# Patient Record
Sex: Male | Born: 1955 | Race: White | Hispanic: No | Marital: Married | Smoking: Current some day smoker
Health system: Southern US, Community
[De-identification: ages and names within clinical notes are randomized; demographics above are authoritative.]

## PROBLEM LIST (undated history)

## (undated) DIAGNOSIS — I2510 Atherosclerotic heart disease of native coronary artery without angina pectoris: Secondary | ICD-10-CM

## (undated) DIAGNOSIS — I219 Acute myocardial infarction, unspecified: Secondary | ICD-10-CM

## (undated) DIAGNOSIS — J8410 Pulmonary fibrosis, unspecified: Secondary | ICD-10-CM

## (undated) DIAGNOSIS — F419 Anxiety disorder, unspecified: Secondary | ICD-10-CM

## (undated) DIAGNOSIS — J439 Emphysema, unspecified: Secondary | ICD-10-CM

## (undated) DIAGNOSIS — M1990 Unspecified osteoarthritis, unspecified site: Secondary | ICD-10-CM

## (undated) HISTORY — DX: Anxiety disorder, unspecified: F41.9

## (undated) HISTORY — PX: NO PAST SURGERIES: SHX2092

## (undated) HISTORY — DX: Pulmonary fibrosis, unspecified: J84.10

## (undated) HISTORY — DX: Emphysema, unspecified: J43.9

---

## 2003-07-17 ENCOUNTER — Encounter
Admission: RE | Admit: 2003-07-17 | Discharge: 2003-10-15 | Payer: Self-pay | Admitting: Physical Medicine & Rehabilitation

## 2003-08-24 ENCOUNTER — Ambulatory Visit (HOSPITAL_COMMUNITY): Admission: RE | Admit: 2003-08-24 | Discharge: 2003-08-24 | Payer: Self-pay | Admitting: Anesthesiology

## 2004-09-05 ENCOUNTER — Encounter: Admission: RE | Admit: 2004-09-05 | Discharge: 2004-09-05 | Payer: Self-pay | Admitting: Orthopedic Surgery

## 2004-09-22 ENCOUNTER — Ambulatory Visit (HOSPITAL_COMMUNITY): Admission: RE | Admit: 2004-09-22 | Discharge: 2004-09-22 | Payer: Self-pay | Admitting: Orthopedic Surgery

## 2004-10-04 ENCOUNTER — Ambulatory Visit (HOSPITAL_COMMUNITY): Admission: RE | Admit: 2004-10-04 | Discharge: 2004-10-04 | Payer: Self-pay | Admitting: Orthopedic Surgery

## 2004-10-04 ENCOUNTER — Ambulatory Visit (HOSPITAL_BASED_OUTPATIENT_CLINIC_OR_DEPARTMENT_OTHER): Admission: RE | Admit: 2004-10-04 | Discharge: 2004-10-04 | Payer: Self-pay | Admitting: Orthopedic Surgery

## 2005-02-16 ENCOUNTER — Ambulatory Visit (HOSPITAL_COMMUNITY): Admission: RE | Admit: 2005-02-16 | Discharge: 2005-02-16 | Payer: Self-pay | Admitting: Orthopedic Surgery

## 2005-08-04 ENCOUNTER — Encounter: Admission: RE | Admit: 2005-08-04 | Discharge: 2005-08-04 | Payer: Self-pay | Admitting: Neurosurgery

## 2018-04-05 ENCOUNTER — Telehealth: Payer: Self-pay | Admitting: Cardiovascular Disease

## 2018-04-05 ENCOUNTER — Ambulatory Visit (INDEPENDENT_AMBULATORY_CARE_PROVIDER_SITE_OTHER): Payer: Non-veteran care | Admitting: Cardiovascular Disease

## 2018-04-05 ENCOUNTER — Encounter: Payer: Self-pay | Admitting: Cardiovascular Disease

## 2018-04-05 DIAGNOSIS — I214 Non-ST elevation (NSTEMI) myocardial infarction: Secondary | ICD-10-CM | POA: Diagnosis not present

## 2018-04-05 DIAGNOSIS — E785 Hyperlipidemia, unspecified: Secondary | ICD-10-CM | POA: Insufficient documentation

## 2018-04-05 DIAGNOSIS — E782 Mixed hyperlipidemia: Secondary | ICD-10-CM | POA: Diagnosis not present

## 2018-04-05 DIAGNOSIS — Z720 Tobacco use: Secondary | ICD-10-CM | POA: Insufficient documentation

## 2018-04-05 DIAGNOSIS — I10 Essential (primary) hypertension: Secondary | ICD-10-CM | POA: Diagnosis not present

## 2018-04-05 DIAGNOSIS — R0989 Other specified symptoms and signs involving the circulatory and respiratory systems: Secondary | ICD-10-CM

## 2018-04-05 MED ORDER — BUPROPION HCL ER (SR) 150 MG PO TB12: 150.0000 mg | ORAL_TABLET | Freq: Two times a day (BID) | ORAL | 2 refills | Status: DC

## 2018-04-05 MED ORDER — METOPROLOL SUCCINATE ER 25 MG PO TB24: 25.0000 mg | ORAL_TABLET | Freq: Every day | ORAL | 3 refills | Status: DC

## 2018-04-05 MED ORDER — VARENICLINE TARTRATE 1 MG PO TABS: 1.0000 mg | ORAL_TABLET | Freq: Two times a day (BID) | ORAL | 1 refills | Status: DC

## 2018-04-05 MED ORDER — VARENICLINE TARTRATE 0.5 MG X 11 & 1 MG X 42 PO MISC: ORAL | 0 refills | Status: DC

## 2018-04-05 NOTE — Assessment & Plan Note (Signed)
History of hyperlipidemia on high-dose atorvastatin with recent non-STEMI.  Will check a lipid liver profile today

## 2018-04-05 NOTE — Assessment & Plan Note (Signed)
History of essential hypertension her blood pressure measured today at 144/88.  He is on low-dose metoprolol.  I am going to increase this to 25 mg of long-acting metoprolol a day.

## 2018-04-05 NOTE — Telephone Encounter (Signed)
04/05/2018 Patient walked in office with an [employer] Form to be filled out he signed release form and gave me 25.00 check, I then inter-office both form and check to [employer] to process.  cbr

## 2018-04-05 NOTE — Assessment & Plan Note (Signed)
Bilateral carotid bruits on exam today.  We will obtain carotid Doppler studies to further evaluate.

## 2018-04-05 NOTE — Progress Notes (Signed)
04/05/2018 [facility] Bissonette [facility]   1955-05-29  [id number]  Primary Physician Reeves Dam, MD Primary Cardiologist: Lorretta Harp MD Lupe Carney, [place]  HPI:  Phillip [name] is a 63 y.o. mildly overweight married Caucasian male father 2, grandfather 2 grandchildren referred by Tawanna Cooler at the [facility] to be established in my practice because of recent non-STEMI.  His wife Aldona Bar was the daughter of "Theadore Nan" Phineas Douglas who is my long-term patient and who died on 22-May-2015.  The patient has a history of 100-pack-year tobacco abuse currently smoking 1/2 pack/day as well as hypertension hyperlipidemia.  There is no family history.  He is never had a stroke but did have a non-STEMI on 02/06/2018.  He is having chest pain and the [facility] and was transferred for cath at [facility] which happened the following day.  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  He is currently on low-dose aspirin and Plavix.  He has had no recurrent symptoms.   Current Meds  Medication Sig  . aspirin EC 81 MG tablet Take 81 mg by mouth daily.  Marland Kitchen atorvastatin (LIPITOR) 80 MG tablet Take 80 mg by mouth daily.  . clopidogrel (PLAVIX) 75 MG tablet Take 75 mg by mouth daily.  . metoprolol succinate (TOPROL-XL) 25 MG 24 hr tablet Take 25 mg by mouth daily.  . nitroGLYCERIN (NITROSTAT) 0.4 MG SL tablet Place 0.4 mg under the tongue every 5 (five) minutes as needed for chest pain.     Allergies  Allergen Reactions  . Iodinated Diagnostic Agents Dermatitis and Itching  . Prednisone Hives and Swelling    Social History   Socioeconomic History  . Marital status: Married    Spouse name: Not on file  . Number of children: Not on file  . Years of education: Not on file  . Highest education level: Not on file  Occupational History  . Not on file  Social Needs  . Financial resource strain: Not on file  . Food insecurity:    Worry: Not on file   Inability: Not on file  . Transportation needs:    Medical: Not on file    Non-medical: Not on file  Tobacco Use  . Smoking status: Not on file  Substance and Sexual Activity  . Alcohol use: Not on file  . Drug use: Not on file  . Sexual activity: Not on file  Lifestyle  . Physical activity:    Days per week: Not on file    Minutes per session: Not on file  . Stress: Not on file  Relationships  . Social connections:    Talks on phone: Not on file    Gets together: Not on file    Attends religious service: Not on file    Active member of club or organization: Not on file    Attends meetings of clubs or organizations: Not on file    Relationship status: Not on file  . Intimate partner violence:    Fear of current or ex partner: Not on file    Emotionally abused: Not on file    Physically abused: Not on file    Forced sexual activity: Not on file  Other Topics Concern  . Not on file  Social History Narrative  . Not on file     Review of Systems: General: negative for chills, fever, night sweats or weight changes.  Cardiovascular: negative for chest pain, dyspnea on  exertion, edema, orthopnea, palpitations, paroxysmal nocturnal dyspnea or shortness of breath Dermatological: negative for rash Respiratory: negative for cough or wheezing Urologic: negative for hematuria Abdominal: negative for nausea, vomiting, diarrhea, bright red blood per rectum, melena, or hematemesis Neurologic: negative for visual changes, syncope, or dizziness All other systems reviewed and are otherwise negative except as noted above.    Blood pressure (!) 144/88, pulse 70, height 5\' 9"  (1.753 m), weight 192 lb (87.1 kg).  General appearance: alert and no distress Neck: no adenopathy, no JVD, supple, symmetrical, trachea midline, thyroid not enlarged, symmetric, no tenderness/mass/nodules and Soft bilateral carotid bruits Lungs: clear to auscultation bilaterally Heart: regular rate and rhythm, S1,  S2 normal, no murmur, click, rub or gallop Extremities: extremities normal, atraumatic, no cyanosis or edema Pulses: 2+ and symmetric Skin: Skin color, texture, turgor normal. No rashes or lesions Neurologic: Alert and oriented X 3, normal strength and tone. Normal symmetric reflexes. Normal coordination and gait  EKG sinus rhythm at 70 RSR prime in lead V1 consistent with RV conduction delay and/or incomplete right bundle branch block.  I personally reviewed this EKG.  ASSESSMENT AND PLAN:   Tobacco abuse History of tobacco abuse with 100 pack years of smoking now smoking 1/2 pack/day considering options to stop  Hyperlipidemia History of hyperlipidemia on high-dose atorvastatin with recent non-STEMI.  Will check a lipid liver profile today  Essential hypertension History of essential hypertension her blood pressure measured today at 144/88.  He is on low-dose metoprolol.  I am going to increase this to 25 mg of long-acting metoprolol a day.  Non-STEMI (non-ST elevated myocardial infarction) ([place]) She of recent non-STEMI 12//11/19.  He was transferred from [facility] to [facility] where he underwent diagnostic coronary angiography revealing noncritical CAD.  He is currently on aspirin Plavix and has had no recurrent symptoms.      Lorretta Harp MD FACP,FACC,FAHA, [facility] 04/05/2018 11:26 AM

## 2018-04-05 NOTE — Patient Instructions (Addendum)
Medication Instructions:  Your physician has recommended you make the following change in your medication:  INCREASE YOUR METOPROLOL (TOPROL-XL) TO 25 MG DAILY  DISCUSS STARTING VARENICLINE (CHANTIX) OR BUPROPRION (WELLBUTRIN) WITH THE [facility]. YOU MAY START WHICHEVER ONE OF THESE MEDICATIONS IS COVERED BY YOUR INSURANCE, BUT DO NOT TAKE BOTH OF THESE MEDICATIONS. PLEASE FOLLOW THE MEDICATION DIRECTIONS PROVIDED ON THE PRESCRIPTION.  If you need a refill on your cardiac medications before your next appointment, please call your pharmacy.   Lab work: Your physician recommends that you return for lab work TODAY: LIPID/LIVER  If you have labs (blood work) drawn today and your tests are completely normal, you will receive your results only by: Marland Kitchen MyChart Message (if you have MyChart) OR . A paper copy in the mail If you have any lab test that is abnormal or we need to change your treatment, we will call you to review the results.  Testing/Procedures: Your physician has requested that you have a carotid duplex. This test is an ultrasound of the carotid arteries in your neck. It looks at blood flow through these arteries that supply the brain with blood. Allow one hour for this exam. There are no restrictions or special instructions.    Follow-Up: At [facility], you and your health needs are our priority.  As part of our continuing mission to provide you with exceptional heart care, we have created designated Provider Care Teams.  These Care Teams include your primary Cardiologist (physician) and Advanced Practice Providers (APPs -  Physician Assistants and Nurse Practitioners) who all work together to provide you with the care you need, when you need it. . You will need a follow up appointment in 6 months.  Please call our office 2 months in advance to schedule this appointment.  You may see Dr. Gwenlyn Found or one of the following Advanced Practice Providers on your designated Care Team:   . Kerin Ransom,  [place] . Almyra Deforest, PA-C . Fabian Sharp, PA-C . Jory Sims, DNP . Rosaria Ferries, PA-C . Roby Lofts, PA-C . Sande Rives, PA-C

## 2018-04-05 NOTE — Assessment & Plan Note (Signed)
She of recent non-STEMI 12//11/19.  He was transferred from [facility] to [facility] where he underwent diagnostic coronary angiography revealing noncritical CAD.  He is currently on aspirin Plavix and has had no recurrent symptoms.

## 2018-04-05 NOTE — Assessment & Plan Note (Signed)
History of tobacco abuse with 100 pack years of smoking now smoking 1/2 pack/day considering options to stop

## 2018-04-06 LAB — LIPID PANEL
Chol/HDL Ratio: 5.6 ratio — ABNORMAL HIGH (ref 0.0–5.0)
Cholesterol, Total: 202 mg/dL — ABNORMAL HIGH (ref 100–199)
HDL: 36 mg/dL — ABNORMAL LOW (ref >39)
LDL Calculated: 143 mg/dL — ABNORMAL HIGH (ref 0–99)
Triglycerides: 116 mg/dL (ref 0–149)
VLDL Cholesterol Cal: 23 mg/dL (ref 5–40)

## 2018-04-06 LAB — HEPATIC FUNCTION PANEL
ALT: 14 IU/L (ref 0–44)
AST: 15 IU/L (ref 0–40)
Albumin: 4.7 g/dL (ref 3.8–4.8)
Alkaline Phosphatase: 87 IU/L (ref 39–117)
Bilirubin Total: 0.5 mg/dL (ref 0.0–1.2)
Bilirubin, Direct: 0.13 mg/dL (ref 0.00–0.40)
Total Protein: 7.4 g/dL (ref 6.0–8.5)

## 2018-04-08 ENCOUNTER — Telehealth: Payer: Self-pay | Admitting: *Deleted

## 2018-04-08 DIAGNOSIS — E782 Mixed hyperlipidemia: Secondary | ICD-10-CM

## 2018-04-08 NOTE — Telephone Encounter (Signed)
-----   Message from Lorretta Harp, MD sent at 04/07/2018  4:59 PM EST ----- Refer to [facility] for repatha

## 2018-04-08 NOTE — Telephone Encounter (Signed)
Spoke with pt, he has not been taking the atorvastatin. He has the medication but has not started it yet. He will start the atorvastatin based on these numbers and will have labs checked when he goes to the [place] in June. Lab orders mailed to the pt

## 2018-04-09 ENCOUNTER — Ambulatory Visit (HOSPITAL_COMMUNITY)
Admission: RE | Admit: 2018-04-09 | Discharge: 2018-04-09 | Disposition: A | Discharge status: Home / Self Care | Payer: No Typology Code available for payment source | Source: Ambulatory Visit | Attending: Cardiology | Admitting: Cardiology

## 2018-04-09 DIAGNOSIS — R0989 Other specified symptoms and signs involving the circulatory and respiratory systems: Secondary | ICD-10-CM | POA: Diagnosis not present

## 2018-04-10 ENCOUNTER — Other Ambulatory Visit: Payer: Self-pay | Admitting: *Deleted

## 2018-04-11 ENCOUNTER — Telehealth: Payer: Self-pay | Admitting: Cardiovascular Disease

## 2018-04-11 NOTE — Telephone Encounter (Signed)
Just print prescriptions for both (again) and we can have Dr. Gwenlyn Found sign on Friday

## 2018-04-11 NOTE — Telephone Encounter (Signed)
Spoke with Rucel from [place] in [place] who states that pt unable to bring physical prescriptions for new meds to [place] because he lives far away and it is inconvenient for him. She requests a copy of pt recent labs and most recent MD office note. She also requests that a copy of Rx for Chantix, Wellbutrin, and metoprolol be sent to fax number [phone]. Informed her that Dr. Gwenlyn Found is out of the office and is unable to sign Rx for meds but will fax the rest of the requested info to her today and send Rx with signature if able to find someone to sign or may send blank Rx for records. Rucel agreeable with this.

## 2018-04-11 NOTE — Telephone Encounter (Signed)
[place] with the [facility] is calling on behalf of patient. She states that they are needing the office notes from the patients appt as well as a copy of the prescription that the patient was given in order to fill the medication. Please call to discuss.

## 2018-04-12 ENCOUNTER — Telehealth: Payer: Self-pay

## 2018-04-12 MED ORDER — VARENICLINE TARTRATE 0.5 MG X 11 & 1 MG X 42 PO MISC: ORAL | 0 refills | Status: DC

## 2018-04-12 MED ORDER — VARENICLINE TARTRATE 1 MG PO TABS: 1.0000 mg | ORAL_TABLET | Freq: Two times a day (BID) | ORAL | 1 refills | Status: DC

## 2018-04-12 MED ORDER — BUPROPION HCL ER (SR) 150 MG PO TB12: 150.0000 mg | ORAL_TABLET | Freq: Two times a day (BID) | ORAL | 2 refills | Status: DC

## 2018-04-12 MED ORDER — METOPROLOL SUCCINATE ER 25 MG PO TB24: 25.0000 mg | ORAL_TABLET | Freq: Every day | ORAL | 3 refills | Status: DC

## 2018-04-12 NOTE — Telephone Encounter (Signed)
ERROR

## 2018-04-12 NOTE — Telephone Encounter (Signed)
Information requested by [facility] faxed to [phone]

## 2018-04-18 ENCOUNTER — Telehealth: Payer: Self-pay | Admitting: Cardiovascular Disease

## 2018-04-18 NOTE — Telephone Encounter (Signed)
Attempted to contact regarding questions and changes. No answer. Will attempt again at a later time.

## 2018-04-18 NOTE — Telephone Encounter (Signed)
New Message           Rucel with [facility] is calling today because she need to change the patient meds and she  Has other questions. Pls call and advise.

## 2018-04-19 NOTE — Telephone Encounter (Signed)
Attempted to contact again, number rang x8 times, no answer.

## 2018-04-22 NOTE — Telephone Encounter (Signed)
Spoke with Rucel  Re pt and [employer] is needing clarification on why script for a Wellbutrin and Chantix was given Both scripts were given so pt may see which one would be covered by insurance.Rucel was needing written confirmation office note was faxed [phone]) as requested .

## 2018-05-09 NOTE — Telephone Encounter (Signed)
05/09/2018 Received Attending Physicians Statement Form back from [place] Dr. Gwenlyn Found nurse I then inter- office it to [facility] to finish processing.  cbr

## 2018-09-23 ENCOUNTER — Telehealth: Payer: Self-pay | Admitting: *Deleted

## 2018-09-23 NOTE — Telephone Encounter (Signed)
Mr.Honeycutt, will call us back after he [facility]'s [facility].

## 2020-09-21 ENCOUNTER — Encounter: Payer: Self-pay | Admitting: Pulmonary Disease

## 2020-10-21 ENCOUNTER — Ambulatory Visit
Admission: RE | Admit: 2020-10-21 | Discharge: 2020-10-21 | Disposition: A | Discharge status: Home / Self Care | Payer: Self-pay | Source: Ambulatory Visit | Attending: Pulmonary Disease | Admitting: Pulmonary Disease

## 2020-10-21 ENCOUNTER — Telehealth: Payer: Self-pay | Admitting: Pulmonary Disease

## 2020-10-21 ENCOUNTER — Encounter: Payer: Self-pay | Admitting: Pulmonary Disease

## 2020-10-21 ENCOUNTER — Other Ambulatory Visit: Payer: Self-pay

## 2020-10-21 ENCOUNTER — Ambulatory Visit (INDEPENDENT_AMBULATORY_CARE_PROVIDER_SITE_OTHER): Payer: No Typology Code available for payment source | Admitting: Pulmonary Disease

## 2020-10-21 VITALS — BP 136/80 | HR 76 | Temp 98.5°F | Ht 69.0 in | Wt 167.0 lb

## 2020-10-21 DIAGNOSIS — R911 Solitary pulmonary nodule: Secondary | ICD-10-CM

## 2020-10-21 DIAGNOSIS — R918 Other nonspecific abnormal finding of lung field: Secondary | ICD-10-CM

## 2020-10-21 DIAGNOSIS — Z720 Tobacco use: Secondary | ICD-10-CM

## 2020-10-21 DIAGNOSIS — J439 Emphysema, unspecified: Secondary | ICD-10-CM | POA: Diagnosis not present

## 2020-10-21 MED ORDER — SPIRIVA RESPIMAT 2.5 MCG/ACT IN AERS: 2.0000 | INHALATION_SPRAY | Freq: Every day | RESPIRATORY_TRACT | 0 refills | Status: AC

## 2020-10-21 NOTE — Patient Instructions (Addendum)
We will schedule you for bronchoscopy next week.   Start spiriva 2.73mcg 2 puffs daily and let us know if you have any improvement in your cough.   Recommend quitting smoking after this last pack of cigarettes.

## 2020-10-21 NOTE — Progress Notes (Signed)
Synopsis: Referred in August 2022 for COPD  Subjective:   PATIENT ID: Phillip [name] GENDER: male DOB: January 11, 1956, MRN: [id number]   HPI  Chief Complaint  Patient presents with   Consult    Patient wife reports had a CT scan with abnormal scan.    Abdulhamid Olgin is a 65 year old male, daily smoker who is referred to pulmonary clinic for evaluation of COPD and lung mass.  He is referred from the [facility] clinic for exertional shortness of breath, cough with sputum production, weight loss/loss of appetite and a lung mass noted on CT chest 10/19/2020.  The CT chest shows a thick-walled cavitary lung lesion in the right lower lobe superior segment measuring 6.9 x 4.0 x 6.1 cm.  He has mediastinal lymphadenopathy, right upper paratracheal node measures 1 cm.  A precarinal node measuring 1.4 cm and a subcarinal node measuring 1.9 cm.  There is background subpleural reticulation, honeycombing and emphysematous changes.  He also notes clubbing of his fingernails which had been there for many years.  He has noted pink-tinged sputum over the last couple of days.  The exertional shortness of breath is worse on hot humid days.  He denies any wheezing.  He continues to smoke half a pack per day.  He was previously smoking 2.5 packs/day.  He has been smoking for 40+ years.  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  No family history of lung cancer.  Past Medical History:  Diagnosis Date   Anxiety    Emphysema lung ([place])    Pulmonary fibrosis ([place])      History reviewed. No pertinent family history.   Social History   Socioeconomic History   Marital status: Married    Spouse name: Not on file   Number of children: Not on file   Years of education: Not on file   Highest education level: Not on file  Occupational History   Not on file  Tobacco Use   Smoking status: Some  Days    Packs/day: 0.50    Types: Cigarettes    Start date: 02/28/1980   Smokeless tobacco: Never  Substance and Sexual Activity   Alcohol use: Not Currently   Drug use: Never   Sexual activity: Not on file  Other Topics Concern   Not on file  Social History Narrative   Not on file   Social Determinants of Health   Financial Resource Strain: Not on file  Food Insecurity: Not on file  Transportation Needs: Not on file  Physical Activity: Not on file  Stress: Not on file  Social Connections: Not on file  Intimate Partner Violence: Not on file     Allergies  Allergen Reactions   Iodinated Diagnostic Agents Dermatitis and Itching   Prednisone Hives and Swelling     Outpatient Medications Prior to Visit  Medication Sig Dispense Refill   aspirin EC 81 MG tablet Take 81 mg by mouth daily.     atorvastatin (LIPITOR) 80 MG tablet Take 80 mg by mouth daily.     fluocinonide cream (LIDEX) 0.05 % Apply a small amount as needed.     hydrOXYzine (ATARAX/VISTARIL) 10 MG tablet Take 10 mg by mouth 2 (two) times daily as needed.     Metoprolol Succinate 100 MG CS24 Take 0.5 tablets by mouth daily.     nitroGLYCERIN (NITROSTAT) 0.4 MG SL  tablet Place 0.4 mg under the tongue every 5 (five) minutes as needed for chest pain.     valsartan (DIOVAN) 160 MG tablet Take 0.5 tablets by mouth daily.     clopidogrel (PLAVIX) 75 MG tablet Take 75 mg by mouth daily.     amoxicillin-clavulanate (AUGMENTIN) 875-125 MG tablet Take 1 tablet by mouth 2 (two) times daily. (Patient not taking: Reported on 10/21/2020)     buPROPion (WELLBUTRIN SR) 150 MG 12 hr tablet Take 1 tablet (150 mg total) by mouth 2 (two) times daily. If this medication is covered by the [employer], take one tablet daily for 3 days. After 3 days, take 2 tablets daily. (Patient not taking: Reported on 10/21/2020) 30 tablet 2   metoprolol succinate (TOPROL-XL) 25 MG 24 hr tablet Take 1 tablet (25 mg total) by mouth daily. (Patient not  taking: Reported on 10/21/2020) 90 tablet 3   varenicline (CHANTIX STARTING MONTH PAK) 0.5 MG X 11 & 1 MG X 42 tablet Take one 0.5 mg tablet by mouth once daily for 3 days, then increase to one 0.5 mg tablet twice daily for 4 days, then increase to one 1 mg tablet twice daily.  YOU MAY START THIS MEDICATION IF IT IS COVERED BY THE [facility] INSURANCE (Patient not taking: Reported on 10/21/2020) 53 tablet 0   varenicline (CHANTIX) 1 MG tablet Take 1 tablet (1 mg total) by mouth 2 (two) times daily. If this medication is covered by the [employer], take 1 tablet twice a day (Patient not taking: Reported on 10/21/2020) 30 tablet 1   No facility-administered medications prior to visit.    Review of Systems  Constitutional:  Positive for weight loss. Negative for chills, fever and malaise/fatigue.  HENT:  Negative for congestion, sinus pain and sore throat.   Eyes: Negative.   Respiratory:  Positive for cough, sputum production and shortness of breath. Negative for hemoptysis and wheezing.   Cardiovascular:  Positive for chest pain. Negative for palpitations, orthopnea, claudication and leg swelling.  Gastrointestinal:  Negative for abdominal pain, heartburn, nausea and vomiting.  Genitourinary: Negative.   Musculoskeletal:  Negative for joint pain and myalgias.  Skin:  Negative for rash.  Neurological:  Negative for weakness.  Endo/Heme/Allergies: Negative.   Psychiatric/Behavioral: Negative.     Objective:   Vitals:   10/21/20 0956  BP: 136/80  Pulse: 76  Temp: 98.5 F (36.9 C)  TempSrc: Oral  SpO2: 96%  Weight: 75.8 kg  Height: 5\' 9"  (1.753 m)   Physical Exam Constitutional:      General: He is not in acute distress. HENT:     Head: Normocephalic and atraumatic.  Eyes:     Extraocular Movements: Extraocular movements intact.     Conjunctiva/sclera: Conjunctivae normal.     Pupils: Pupils are equal, round, and reactive to light.  Cardiovascular:     Rate and Rhythm: Normal rate  and regular rhythm.     Pulses: Normal pulses.     Heart sounds: Normal heart sounds. No murmur heard. Pulmonary:     Effort: Pulmonary effort is normal.     Breath sounds: Rales (right base) present. No wheezing or rhonchi.  Abdominal:     General: Bowel sounds are normal.     Palpations: Abdomen is soft.  Musculoskeletal:     Right lower leg: No edema.     Left lower leg: No edema.     Comments: Clubbing of fingernails present  Lymphadenopathy:     Cervical:  No cervical adenopathy.  Skin:    General: Skin is warm and dry.  Neurological:     General: No focal deficit present.     Mental Status: He is alert.  Psychiatric:        Mood and Affect: Mood normal.        Behavior: Behavior normal.        Thought Content: Thought content normal.        Judgment: Judgment normal.    CBC No results found for: WBC, RBC, HGB, HCT, PLT, MCV, MCH, MCHC, RDW, LYMPHSABS, MONOABS, EOSABS, BASOSABS  Chest imaging:  PFT: No flowsheet data found.  Echo 2019: LV EF 55-60%. RV size and function normal. Atria are normal size. No valvular abnormalities.  Heart Catheterization 2019: - There is calcified plaque in the proximal LAD up to 40%, interrogated  with IVUS, no findings of plaque rupture, determined to not be the  culprit.  - Left ventricular filling pressures (LVEDP = 13 mm Hg).   Assessment & Plan:   Right lower lobe lung mass  Pulmonary emphysema, unspecified emphysema type ([place])  Tobacco use  Discussion: Phillip [name] is a 65 year old male, daily smoker who is referred to pulmonary clinic for evaluation of COPD and lung mass.  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  The right lower lobe cavitary lung mass and mediastinal adenopathy is concerning for lung cancer.  We will schedule patient for  bronchoscopy and EBUS/TBNA in order to obtain diagnosis.  He will then need referral to oncology for further evaluation and management.  We will start him on Spiriva 2.5 MCG 2 puffs daily for his shortness of breath and cough due to his obstructive lung disease.  He plans to quit smoking after this last pack of cigarettes.  Follow-up in 3 months.  >60 minutes was spent on this visit which included reviewing patient records, showing patient and wife CT scan images, discussing and scheduling procedure and completing documentation.   Freda Jackson, MD [facility] Office: [phone]   Current Outpatient Medications:    aspirin EC 81 MG tablet, Take 81 mg by mouth daily., Disp: , Rfl:    atorvastatin (LIPITOR) 80 MG tablet, Take 80 mg by mouth daily., Disp: , Rfl:    fluocinonide cream (LIDEX) 0.05 %, Apply a small amount as needed., Disp: , Rfl:    hydrOXYzine (ATARAX/VISTARIL) 10 MG tablet, Take 10 mg by mouth 2 (two) times daily as needed., Disp: , Rfl:    Metoprolol Succinate 100 MG CS24, Take 0.5 tablets by mouth daily., Disp: , Rfl:    nitroGLYCERIN (NITROSTAT) 0.4 MG SL tablet, Place 0.4 mg under the tongue every 5 (five) minutes as needed for chest pain., Disp: , Rfl:    Tiotropium Bromide Monohydrate (SPIRIVA RESPIMAT) 2.5 MCG/ACT AERS, Inhale 2 puffs into the lungs daily., Disp: 4 g, Rfl: 0   valsartan (DIOVAN) 160 MG tablet, Take 0.5 tablets by mouth daily., Disp: , Rfl:

## 2020-10-21 NOTE — Addendum Note (Signed)
Addended by: Valerie Salts on: 10/21/2020 04:02 PM   Modules accepted: Orders

## 2020-10-21 NOTE — H&P (View-Only) (Signed)
Synopsis: Referred in August 2022 for COPD  Subjective:   PATIENT ID: Phillip [name] GENDER: male DOB: [birth year], MRN: [id number]   HPI  Chief Complaint  Patient presents with   Consult    Patient wife reports had a CT scan with abnormal scan.    Phillip [name] is a 65 year old male, daily smoker who is referred to pulmonary clinic for evaluation of COPD and lung mass.  He is referred from the [facility] clinic for exertional shortness of breath, cough with sputum production, weight loss/loss of appetite and a lung mass noted on CT chest 10/19/2020.  The CT chest shows a thick-walled cavitary lung lesion in the right lower lobe superior segment measuring 6.9 x 4.0 x 6.1 cm.  He has mediastinal lymphadenopathy, right upper paratracheal node measures 1 cm.  A precarinal node measuring 1.4 cm and a subcarinal node measuring 1.9 cm.  There is background subpleural reticulation, honeycombing and emphysematous changes.  He also notes clubbing of his fingernails which had been there for many years.  He has noted pink-tinged sputum over the last couple of days.  The exertional shortness of breath is worse on hot humid days.  He denies any wheezing.  He continues to smoke half a pack per day.  He was previously smoking 2.5 packs/day.  He has been smoking for 40+ years.  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  No family history of lung cancer.  Past Medical History:  Diagnosis Date   Anxiety    Emphysema lung ([place])    Pulmonary fibrosis ([place])      History reviewed. No pertinent family history.   Social History   Socioeconomic History   Marital status: Married    Spouse name: Not on file   Number of children: Not on file   Years of education: Not on file   Highest education level: Not on file  Occupational History   Not on file  Tobacco Use   Smoking status: Some  Days    Packs/day: 0.50    Types: Cigarettes    Start date: 02/28/1980   Smokeless tobacco: Never  Substance and Sexual Activity   Alcohol use: Not Currently   Drug use: Never   Sexual activity: Not on file  Other Topics Concern   Not on file  Social History Narrative   Not on file   Social Determinants of Health   Financial Resource Strain: Not on file  Food Insecurity: Not on file  Transportation Needs: Not on file  Physical Activity: Not on file  Stress: Not on file  Social Connections: Not on file  Intimate Partner Violence: Not on file     Allergies  Allergen Reactions   Iodinated Diagnostic Agents Dermatitis and Itching   Prednisone Hives and Swelling     Outpatient Medications Prior to Visit  Medication Sig Dispense Refill   aspirin EC 81 MG tablet Take 81 mg by mouth daily.     atorvastatin (LIPITOR) 80 MG tablet Take 80 mg by mouth daily.     fluocinonide cream (LIDEX) 0.05 % Apply a small amount as needed.     hydrOXYzine (ATARAX/VISTARIL) 10 MG tablet Take 10 mg by mouth 2 (two) times daily as needed.     Metoprolol Succinate 100 MG CS24 Take 0.5 tablets by mouth daily.     nitroGLYCERIN (NITROSTAT) 0.4 MG SL  tablet Place 0.4 mg under the tongue every 5 (five) minutes as needed for chest pain.     valsartan (DIOVAN) 160 MG tablet Take 0.5 tablets by mouth daily.     clopidogrel (PLAVIX) 75 MG tablet Take 75 mg by mouth daily.     amoxicillin-clavulanate (AUGMENTIN) 875-125 MG tablet Take 1 tablet by mouth 2 (two) times daily. (Patient not taking: Reported on 10/21/2020)     buPROPion (WELLBUTRIN SR) 150 MG 12 hr tablet Take 1 tablet (150 mg total) by mouth 2 (two) times daily. If this medication is covered by the [employer], take one tablet daily for 3 days. After 3 days, take 2 tablets daily. (Patient not taking: Reported on 10/21/2020) 30 tablet 2   metoprolol succinate (TOPROL-XL) 25 MG 24 hr tablet Take 1 tablet (25 mg total) by mouth daily. (Patient not  taking: Reported on 10/21/2020) 90 tablet 3   varenicline (CHANTIX STARTING MONTH PAK) 0.5 MG X 11 & 1 MG X 42 tablet Take one 0.5 mg tablet by mouth once daily for 3 days, then increase to one 0.5 mg tablet twice daily for 4 days, then increase to one 1 mg tablet twice daily.  YOU MAY START THIS MEDICATION IF IT IS COVERED BY THE [facility] INSURANCE (Patient not taking: Reported on 10/21/2020) 53 tablet 0   varenicline (CHANTIX) 1 MG tablet Take 1 tablet (1 mg total) by mouth 2 (two) times daily. If this medication is covered by the [employer], take 1 tablet twice a day (Patient not taking: Reported on 10/21/2020) 30 tablet 1   No facility-administered medications prior to visit.    Review of Systems  Constitutional:  Positive for weight loss. Negative for chills, fever and malaise/fatigue.  HENT:  Negative for congestion, sinus pain and sore throat.   Eyes: Negative.   Respiratory:  Positive for cough, sputum production and shortness of breath. Negative for hemoptysis and wheezing.   Cardiovascular:  Positive for chest pain. Negative for palpitations, orthopnea, claudication and leg swelling.  Gastrointestinal:  Negative for abdominal pain, heartburn, nausea and vomiting.  Genitourinary: Negative.   Musculoskeletal:  Negative for joint pain and myalgias.  Skin:  Negative for rash.  Neurological:  Negative for weakness.  Endo/Heme/Allergies: Negative.   Psychiatric/Behavioral: Negative.     Objective:   Vitals:   10/21/20 0956  BP: 136/80  Pulse: 76  Temp: 98.5 F (36.9 C)  TempSrc: Oral  SpO2: 96%  Weight: 75.8 kg  Height: 5\' 9"  (1.753 m)   Physical Exam Constitutional:      General: He is not in acute distress. HENT:     Head: Normocephalic and atraumatic.  Eyes:     Extraocular Movements: Extraocular movements intact.     Conjunctiva/sclera: Conjunctivae normal.     Pupils: Pupils are equal, round, and reactive to light.  Cardiovascular:     Rate and Rhythm: Normal rate  and regular rhythm.     Pulses: Normal pulses.     Heart sounds: Normal heart sounds. No murmur heard. Pulmonary:     Effort: Pulmonary effort is normal.     Breath sounds: Rales (right base) present. No wheezing or rhonchi.  Abdominal:     General: Bowel sounds are normal.     Palpations: Abdomen is soft.  Musculoskeletal:     Right lower leg: No edema.     Left lower leg: No edema.     Comments: Clubbing of fingernails present  Lymphadenopathy:     Cervical:  No cervical adenopathy.  Skin:    General: Skin is warm and dry.  Neurological:     General: No focal deficit present.     Mental Status: He is alert.  Psychiatric:        Mood and Affect: Mood normal.        Behavior: Behavior normal.        Thought Content: Thought content normal.        Judgment: Judgment normal.    CBC No results found for: WBC, RBC, HGB, HCT, PLT, MCV, MCH, MCHC, RDW, LYMPHSABS, MONOABS, EOSABS, BASOSABS  Chest imaging:  PFT: No flowsheet data found.  Echo 2019: LV EF 55-60%. RV size and function normal. Atria are normal size. No valvular abnormalities.  Heart Catheterization 2019: - There is calcified plaque in the proximal LAD up to 40%, interrogated  with IVUS, no findings of plaque rupture, determined to not be the  culprit.  - Left ventricular filling pressures (LVEDP = 13 mm Hg).   Assessment & Plan:   Right lower lobe lung mass  Pulmonary emphysema, unspecified emphysema type ([place])  Tobacco use  Discussion: Phillip [name] is a 65 year old male, daily smoker who is referred to pulmonary clinic for evaluation of COPD and lung mass.  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  The right lower lobe cavitary lung mass and mediastinal adenopathy is concerning for lung cancer.  We will schedule patient for  bronchoscopy and EBUS/TBNA in order to obtain diagnosis.  He will then need referral to oncology for further evaluation and management.  We will start him on Spiriva 2.5 MCG 2 puffs daily for his shortness of breath and cough due to his obstructive lung disease.  He plans to quit smoking after this last pack of cigarettes.  Follow-up in 3 months.  >60 minutes was spent on this visit which included reviewing patient records, showing patient and wife CT scan images, discussing and scheduling procedure and completing documentation.   Freda [place], MD [facility] Office: [phone]   Current Outpatient Medications:    aspirin EC 81 MG tablet, Take 81 mg by mouth daily., Disp: , Rfl:    atorvastatin (LIPITOR) 80 MG tablet, Take 80 mg by mouth daily., Disp: , Rfl:    fluocinonide cream (LIDEX) 0.05 %, Apply a small amount as needed., Disp: , Rfl:    hydrOXYzine (ATARAX/VISTARIL) 10 MG tablet, Take 10 mg by mouth 2 (two) times daily as needed., Disp: , Rfl:    Metoprolol Succinate 100 MG CS24, Take 0.5 tablets by mouth daily., Disp: , Rfl:    nitroGLYCERIN (NITROSTAT) 0.4 MG SL tablet, Place 0.4 mg under the tongue every 5 (five) minutes as needed for chest pain., Disp: , Rfl:    Tiotropium Bromide Monohydrate (SPIRIVA RESPIMAT) 2.5 MCG/ACT AERS, Inhale 2 puffs into the lungs daily., Disp: 4 g, Rfl: 0   valsartan (DIOVAN) 160 MG tablet, Take 0.5 tablets by mouth daily., Disp: , Rfl:

## 2020-10-21 NOTE — Telephone Encounter (Signed)
Please schedule the following:  Provider performing procedure: Freda [place] Diagnosis: Lung mass, mediastinal adenopathy Which side if for nodule / mass? Right Procedure: Flexible bronchoscopy, EBUS with TBNA, brushings  Has patient been spoken to by Provider and given informed consent? Yes Anesthesia: Yes Do you need Fluro? No Duration of procedure: 1.5 hours Date: 10/27/20 Alternate Date: 10/28/20  Time: anytime Location: [place] Does patient have OSA? No DM? No Or Latex allergy? No Medication Restriction: None Anticoagulate/Antiplatelet: None Pre-op Labs Ordered:determined by Anesthesia Imaging request: None  (If, SuperDimension CT Chest, please have STAT courier sent to ENDO)  Please coordinate Pre-op COVID Testing

## 2020-10-25 ENCOUNTER — Telehealth: Payer: Self-pay | Admitting: Pulmonary Disease

## 2020-10-25 NOTE — Addendum Note (Signed)
Addended by: Amado Coe on: 10/25/2020 08:52 AM   Modules accepted: Orders

## 2020-10-26 NOTE — Telephone Encounter (Signed)
Holding for [place] from [place]

## 2020-10-26 NOTE — Telephone Encounter (Signed)
Scheduled for 9/1 at 7:30.  Will go for covid test tomorrow.  Gave appt info to pt.  Wife wants JD to be aware of pt's high anxiety.

## 2020-10-27 ENCOUNTER — Other Ambulatory Visit: Payer: Self-pay

## 2020-10-27 ENCOUNTER — Other Ambulatory Visit: Payer: Self-pay | Admitting: Internal Medicine

## 2020-10-27 ENCOUNTER — Telehealth: Payer: Self-pay | Admitting: Pulmonary Disease

## 2020-10-27 ENCOUNTER — Encounter (HOSPITAL_COMMUNITY): Payer: Self-pay | Admitting: Pulmonary Disease

## 2020-10-27 LAB — SARS CORONAVIRUS 2 (TAT 6-24 HRS): SARS Coronavirus 2: NEGATIVE

## 2020-10-27 NOTE — Telephone Encounter (Signed)
Rec'd [place] Phillip [name]

## 2020-10-27 NOTE — Anesthesia Preprocedure Evaluation (Addendum)
Anesthesia Evaluation  Patient identified by MRN, date of birth, ID band Patient awake    Reviewed: Allergy & Precautions, NPO status , Patient's Chart, lab work & pertinent test results  Airway Mallampati: III  TM Distance: >3 FB Neck ROM: Full    Dental  (+) Edentulous Upper, Edentulous Lower   Pulmonary COPD,  COPD inhaler, Current Smoker,  Pulmonary fibrosis   Pulmonary exam normal breath sounds clear to auscultation       Cardiovascular hypertension, + CAD and + Past MI  Normal cardiovascular exam Rhythm:Regular Rate:Normal     Neuro/Psych PSYCHIATRIC DISORDERS Anxiety negative neurological ROS     GI/Hepatic negative GI ROS, Neg liver ROS,   Endo/Other  negative endocrine ROS  Renal/GU negative Renal ROS  negative genitourinary   Musculoskeletal  (+) Arthritis , Osteoarthritis,    Abdominal   Peds negative pediatric ROS (+)  Hematology negative hematology ROS (+)   Anesthesia Other Findings + cough  Reproductive/Obstetrics negative OB ROS                           Anesthesia Physical Anesthesia Plan  ASA: 3  Anesthesia Plan: General   Post-op Pain Management:    Induction: Intravenous  PONV Risk Score and Plan: 1 and Treatment may vary due to age or medical condition, Ondansetron, Midazolam and Dexamethasone  Airway Management Planned: Oral ETT  Additional Equipment:   Intra-op Plan:   Post-operative Plan: Extubation in OR  Informed Consent: I have reviewed the patients History and Physical, chart, labs and discussed the procedure including the risks, benefits and alternatives for the proposed anesthesia with the patient or authorized representative who has indicated his/her understanding and acceptance.       Plan Discussed with: Anesthesiologist and CRNA  Anesthesia Plan Comments: (PAT note written 10/27/2020 by Myra Gianotti, PA-C. )      Anesthesia  Quick Evaluation

## 2020-10-27 NOTE — Telephone Encounter (Signed)
Noted.   Will close encounter.   Nothing further needed at this time.

## 2020-10-27 NOTE — Progress Notes (Signed)
Anesthesia Chart Review: Phillip [name]  Case: [id number] Date/Time: 10/28/20 0730   Procedure: VIDEO BRONCHOSCOPY WITH ENDOBRONCHIAL ULTRASOUND   Anesthesia type: General   Pre-op diagnosis: lung mass, mediastinal adenopathy   Location: [facility] ROOM 1 / [place] [facility]   Surgeons: Freddi Starr, MD       DISCUSSION: Patient is a 65 year old male scheduled for the above procedure.  Patient was referred to low [facility] pulmonology by the [facility] for abnormal chest CT showing a RLL cavitating lesion.  He was seen by Dr. Erin Fulling on 10/21/20.  He was started on Spiriva and smoking cessation encouraged.  The above procedure also recommended to obtain tissue diagnosis.  History includes smoking (1/2 PPD, previously 2.5 PPD), CAD (NSTEMI, 80% small RPL likely culprit, medical therapy 02/07/18), pulmonary fibrosis, emphysema, anxiety.  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  Per [facility] records in care everywhere, patient was last evaluated by cardiologist Dr. Ardeen Jourdain on 09/24/2020. He wrote, "CAD - stable, no ischemic sxs. If he were to need to use SL NTG or describe  anginal sxs then low threshold to repeat cath. Preserved LV function. ASA, ARB,  statin, betablocker. Needs to stop smoking."  He ordered a chest CT after chest x-ray for dyspnea/cough showed right infrahilar opacity with concern for lymphadenopathy, volume loss and interstitial lung disease.  Patient also reported some weight loss.  He was treated with 7-day course of Augmentin as well.  CT results as below with referral to pulmonology.  He is a same day work-up. He is for labs/EKG as indicated and anesthesia team evaluation on the day of surgery.    VS: 10/21/20: BP 136/80, HR 76, WT 75.8 KG    PROVIDERS: Celene Squibb, MD is PCP  Ardeen Jourdain, MD is cardiologist [facility]). Last visit 09/24/20. (Locally, patient was last evaluated at [facility] by Quay Burow, MD on  04/05/18.)    LABS: For day of surgery as indicated. According to [place], BMET 10/11/20 showed Cr 0.826, BUN 19, glucose 92, Na 134, K 4.0.    IMAGES: CT Chest 10/19/20 [facility]): FINDINGS:  - Aorta and main pulmonary artery normal in size. Mild  coronary artery calcifications. No pericardial effusion.  - Thick-walled cavitary lung lesion in the right lower lobe  superior segment abutting the pleura. Masslike lesion measures  6.9 x 4.0 x 6.1 cm. Mild peribronchovascular nodularity tracking  centrally towards the hilum.  - Few suspicious mediastinal nodes. A right upper paratracheal node  measures 1.0 cm. A precarinal node measures 1.4 cm. A subcarinal  node measures 1.9 cm in short axis. Few other smaller nonspecific  bilateral mediastinal and hilar nodes.  - There is a background of basilar dependent subpleural  reticulation and cystic changes. Emphysema with fibrotic  reticulation in the lung apices. No pleural effusion.  - Senescent changes of the osseous structures. No lytic or blastic  osseous lesion.  Impression: Cavitating lesion in the right lower lobe could be considered malignancy until proven otherwise. Recommend thoracic surgery or pulmonology consultation. Suspicious mediastinal adenopathy. 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    EKG: Per 09/24/20 [facility] note by Dr. Sabra Heck, "EKG: I have personally reviewed and it shows NSR, PAC, normal EKG."  Last EKG in [facility] from 04/05/18: NSR, possible LAE, incomplete RBBB.   CV: According to 09/24/20 [facility] note by cardiologist Dr. Sabra Heck : "Echo Dec 2020:There is borderline concentric left ventricular  hypertrophy. The left ventricle is normal in size.The left ventricle is hyperdynamic. Ejection Fraction = >70% .The left ventricular wall motion is  normal.Grade I diastolic dysfunction.The right ventricle is normal in size and  function.The aortic root is normal size.There is no pericardial effusion.No  significant  valvular stenosis or regurgitation."   US Carotid 04/09/18: - Prominent proximal ICA with narrowing noted in the mid and distal  segments.  Summary:  - Right Carotid: Velocities in the right ICA are consistent with a 1-39% stenosis. Prominent proximal ICA with narrowing noted in the mid and distal segments.  - Left Carotid: Velocities in the left ICA are consistent with a 1-39% stenosis. Prominent proximal ICA with narrowing noted in the mid and distal segments.  - Vertebrals:  Bilateral vertebral arteries demonstrate antegrade flow.  -  Subclavians: Right subclavian artery flow was disturbed. Normal flow hemodynamics were seen in the left subclavian artery.    Echo 02/07/18 ([facility]): Interpretation Summary  A complete two-dimensional transthoracic echocardiogram with color flow  Doppler and spectral Doppler was performed. The study was technically  adequate. The left ventricle is grossly normal size.  There is normal left ventricular wall thickness.  The left ventricular ejection fraction is normal (55-60%).  The left ventricular wall motion is normal.  The aortic valve is trileaflet.  The left ventricular diastolic function is normal.     Cardiac cath 02/07/18 ([facility]): - Hemodynamics and Left Heart Catheterization   Aortic pressure: 145/82 mmHg   Left ventricular filling pressure (LVEDP = 13 mm Hg).   - There was no trans-aortic gradient on catheter pullback    - Coronary Angiography  Dominance: Right  - Left main: Very short, no disease  - Left anterior descending: Large, mild to moderate calcified plaque in the proximal segment up to 30-40% by IVUS.  The biggest arc of calcium with 80 degrees.  There is a moderate diagonal with 30% proximal disease.    - Circumflex: Large caliber with very small OM 1 and 2 with diffuse disease.  OM3 is medium to large with mild diffuse plaque.  40-50% mid LCX stenosis.  The distal LCX system is small.  - Right Coronary: Large caliber  with diffuse mild calcified plaque.  There was some ostial spasm which improved with IC NTG. The rPDA is medium in size and with mild plaquing.  The RCA continuation has a hazy 80% stenosis, with TIMI 2-3 flow distally.  This is the culprit.  - Bypass Graft Angiography:n/a   - Findings: Coronary artery disease to be medically managed (as described above) including small hazy right-sided posterolateral system with haziness/TIMI  2 flow likely culprit.   - Impression: Given small nature and normal EF no wall motion abn, to be medically managed.  - There is calcified plaque in the proximal LAD up to 40%, interrogated with IVUS, no findings of plaque rupture, determined to not be the culprit.  - Left ventricular filling pressures (LVEDP = 13 mm Hg).   - Recommendations:  Medical management (aspirin and clopidogrel) ideally for 1 year. Follow up with primary cardiologist     Past Medical History:  Diagnosis Date   Anxiety    Arthritis    Coronary artery disease    Emphysema lung ([place])    Myocardial infarction [facility])    Pulmonary fibrosis ([place])     History reviewed. No pertinent surgical history.  MEDICATIONS: No current facility-administered medications for this encounter.    aspirin EC 81 MG tablet  atorvastatin (LIPITOR) 80 MG tablet   fluocinonide cream (LIDEX) 0.05 %   hydrOXYzine (ATARAX/VISTARIL) 10 MG tablet   Metoprolol Succinate 100 MG CS24   nitroGLYCERIN (NITROSTAT) 0.4 MG SL tablet   Tiotropium Bromide Monohydrate (SPIRIVA RESPIMAT) 2.5 MCG/ACT AERS   valsartan (DIOVAN) 160 MG tablet    Myra Gianotti, PA-C Surgical Short Stay/Anesthesiology [facility] Phone [phone] [facility] Phone [phone] 10/27/2020 1:17 PM

## 2020-10-27 NOTE — Telephone Encounter (Signed)
Orders placed.  Wille Glaser

## 2020-10-27 NOTE — Telephone Encounter (Signed)
Noted, thank you.  Phillip [name]

## 2020-10-27 NOTE — Telephone Encounter (Signed)
Noted.   Will route message to Dr. Erin Fulling.

## 2020-10-28 ENCOUNTER — Telehealth: Payer: Self-pay | Admitting: Pulmonary Disease

## 2020-10-28 ENCOUNTER — Ambulatory Visit (HOSPITAL_COMMUNITY): Payer: No Typology Code available for payment source | Admitting: Physician Assistant

## 2020-10-28 ENCOUNTER — Ambulatory Visit (HOSPITAL_COMMUNITY): Payer: No Typology Code available for payment source

## 2020-10-28 ENCOUNTER — Encounter (HOSPITAL_COMMUNITY): Payer: Self-pay | Admitting: Pulmonary Disease

## 2020-10-28 ENCOUNTER — Ambulatory Visit (HOSPITAL_COMMUNITY)
Admission: RE | Admit: 2020-10-28 | Discharge: 2020-10-28 | Disposition: A | Discharge status: Home / Self Care | Payer: No Typology Code available for payment source | Attending: Pulmonary Disease | Admitting: Pulmonary Disease

## 2020-10-28 ENCOUNTER — Other Ambulatory Visit: Payer: Self-pay

## 2020-10-28 ENCOUNTER — Encounter (HOSPITAL_COMMUNITY)
Admission: RE | Disposition: A | Discharge status: Home / Self Care | Payer: Self-pay | Source: Home / Self Care | Attending: Pulmonary Disease

## 2020-10-28 DIAGNOSIS — Z91041 Radiographic dye allergy status: Secondary | ICD-10-CM | POA: Diagnosis not present

## 2020-10-28 DIAGNOSIS — F17210 Nicotine dependence, cigarettes, uncomplicated: Secondary | ICD-10-CM | POA: Diagnosis not present

## 2020-10-28 DIAGNOSIS — C3490 Malignant neoplasm of unspecified part of unspecified bronchus or lung: Secondary | ICD-10-CM

## 2020-10-28 DIAGNOSIS — Z888 Allergy status to other drugs, medicaments and biological substances status: Secondary | ICD-10-CM | POA: Insufficient documentation

## 2020-10-28 DIAGNOSIS — J439 Emphysema, unspecified: Secondary | ICD-10-CM | POA: Insufficient documentation

## 2020-10-28 DIAGNOSIS — J8410 Pulmonary fibrosis, unspecified: Secondary | ICD-10-CM | POA: Diagnosis not present

## 2020-10-28 DIAGNOSIS — C3431 Malignant neoplasm of lower lobe, right bronchus or lung: Secondary | ICD-10-CM | POA: Insufficient documentation

## 2020-10-28 DIAGNOSIS — Z79899 Other long term (current) drug therapy: Secondary | ICD-10-CM | POA: Insufficient documentation

## 2020-10-28 DIAGNOSIS — R918 Other nonspecific abnormal finding of lung field: Secondary | ICD-10-CM

## 2020-10-28 DIAGNOSIS — R590 Localized enlarged lymph nodes: Secondary | ICD-10-CM | POA: Insufficient documentation

## 2020-10-28 DIAGNOSIS — I2510 Atherosclerotic heart disease of native coronary artery without angina pectoris: Secondary | ICD-10-CM | POA: Insufficient documentation

## 2020-10-28 DIAGNOSIS — I252 Old myocardial infarction: Secondary | ICD-10-CM | POA: Diagnosis not present

## 2020-10-28 DIAGNOSIS — Z7982 Long term (current) use of aspirin: Secondary | ICD-10-CM | POA: Diagnosis not present

## 2020-10-28 DIAGNOSIS — Z9889 Other specified postprocedural states: Secondary | ICD-10-CM

## 2020-10-28 HISTORY — DX: Atherosclerotic heart disease of native coronary artery without angina pectoris: I25.10

## 2020-10-28 HISTORY — PX: BRONCHIAL WASHINGS: SHX5105

## 2020-10-28 HISTORY — DX: Acute myocardial infarction, unspecified: I21.9

## 2020-10-28 HISTORY — PX: VIDEO BRONCHOSCOPY WITH ENDOBRONCHIAL ULTRASOUND: SHX6177

## 2020-10-28 HISTORY — PX: BRONCHIAL NEEDLE ASPIRATION BIOPSY: SHX5106

## 2020-10-28 HISTORY — PX: BRONCHIAL BRUSHINGS: SHX5108

## 2020-10-28 HISTORY — DX: Unspecified osteoarthritis, unspecified site: M19.90

## 2020-10-28 LAB — BASIC METABOLIC PANEL
Anion gap: 7 (ref 5–15)
BUN: 14 mg/dL (ref 8–23)
CO2: 26 mmol/L (ref 22–32)
Calcium: 8.7 mg/dL — ABNORMAL LOW (ref 8.9–10.3)
Chloride: 102 mmol/L (ref 98–111)
Creatinine, Ser: 0.84 mg/dL (ref 0.61–1.24)
GFR, Estimated: >60 mL/min (ref >60)
Glucose, Bld: 99 mg/dL (ref 70–99)
Potassium: 3.9 mmol/L (ref 3.5–5.1)
Sodium: 135 mmol/L (ref 135–145)

## 2020-10-28 LAB — CBC
HCT: 43.2 % (ref 39.0–52.0)
Hemoglobin: 14 g/dL (ref 13.0–17.0)
MCH: 28.4 pg (ref 26.0–34.0)
MCHC: 32.4 g/dL (ref 30.0–36.0)
MCV: 87.6 fL (ref 80.0–100.0)
Platelets: 201 10*3/uL (ref 150–400)
RBC: 4.93 MIL/uL (ref 4.22–5.81)
RDW: 14.2 % (ref 11.5–15.5)
WBC: 7.8 10*3/uL (ref 4.0–10.5)
nRBC: 0 % (ref 0.0–0.2)

## 2020-10-28 SURGERY — BRONCHOSCOPY, WITH EBUS
Anesthesia: General

## 2020-10-28 MED ORDER — MIDAZOLAM HCL 2 MG/2ML IJ SOLN
INTRAMUSCULAR | Status: DC | PRN
  Administered 2020-10-28: 2 mg via INTRAVENOUS

## 2020-10-28 MED ORDER — IPRATROPIUM-ALBUTEROL 0.5-2.5 (3) MG/3ML IN SOLN
RESPIRATORY_TRACT | Status: AC
  Filled 2020-10-28: qty 3

## 2020-10-28 MED ORDER — PHENYLEPHRINE HCL-NACL 20-0.9 MG/250ML-% IV SOLN
INTRAVENOUS | Status: DC | PRN
  Administered 2020-10-28: 20 ug/min via INTRAVENOUS

## 2020-10-28 MED ORDER — PROPOFOL 10 MG/ML IV BOLUS
INTRAVENOUS | Status: DC | PRN
  Administered 2020-10-28: 130 mg via INTRAVENOUS

## 2020-10-28 MED ORDER — OXYCODONE HCL 5 MG PO TABS: 5.0000 mg | ORAL_TABLET | Freq: Once | ORAL | Status: DC | PRN

## 2020-10-28 MED ORDER — LORAZEPAM 2 MG/ML IJ SOLN: 0.5000 mg | INTRAMUSCULAR | Status: DC | PRN

## 2020-10-28 MED ORDER — FENTANYL CITRATE (PF) 100 MCG/2ML IJ SOLN: 25.0000 ug | INTRAMUSCULAR | Status: DC | PRN

## 2020-10-28 MED ORDER — SUGAMMADEX SODIUM 200 MG/2ML IV SOLN
INTRAVENOUS | Status: DC | PRN
  Administered 2020-10-28: 400 mg via INTRAVENOUS

## 2020-10-28 MED ORDER — DROPERIDOL 2.5 MG/ML IJ SOLN: 0.6250 mg | Freq: Once | INTRAMUSCULAR | Status: DC | PRN

## 2020-10-28 MED ORDER — OXYCODONE HCL 5 MG/5ML PO SOLN: 5.0000 mg | Freq: Once | ORAL | Status: DC | PRN

## 2020-10-28 MED ORDER — PHENYLEPHRINE 40 MCG/ML (10ML) SYRINGE FOR IV PUSH (FOR BLOOD PRESSURE SUPPORT)
PREFILLED_SYRINGE | INTRAVENOUS | Status: DC | PRN
  Administered 2020-10-28: 120 ug via INTRAVENOUS
  Administered 2020-10-28: 160 ug via INTRAVENOUS
  Administered 2020-10-28: 120 ug via INTRAVENOUS

## 2020-10-28 MED ORDER — LACTATED RINGERS IV SOLN: INTRAVENOUS | Status: DC

## 2020-10-28 MED ORDER — ONDANSETRON HCL 4 MG/2ML IJ SOLN
INTRAMUSCULAR | Status: DC | PRN
  Administered 2020-10-28: 4 mg via INTRAVENOUS

## 2020-10-28 MED ORDER — CHLORHEXIDINE GLUCONATE 0.12 % MT SOLN
OROMUCOSAL | Status: AC
  Administered 2020-10-28: 15 mL
  Filled 2020-10-28: qty 15

## 2020-10-28 MED ORDER — FENTANYL CITRATE (PF) 100 MCG/2ML IJ SOLN
INTRAMUSCULAR | Status: DC | PRN
  Administered 2020-10-28 (×2): 50 ug via INTRAVENOUS

## 2020-10-28 MED ORDER — IPRATROPIUM-ALBUTEROL 0.5-2.5 (3) MG/3ML IN SOLN
3.0000 mL | Freq: Once | RESPIRATORY_TRACT | Status: AC
  Administered 2020-10-28: 3 mL via RESPIRATORY_TRACT

## 2020-10-28 MED ORDER — LIDOCAINE 2% (20 MG/ML) 5 ML SYRINGE
INTRAMUSCULAR | Status: DC | PRN
  Administered 2020-10-28: 80 mg via INTRAVENOUS

## 2020-10-28 MED ORDER — PROMETHAZINE HCL 25 MG/ML IJ SOLN: 6.2500 mg | INTRAMUSCULAR | Status: DC | PRN

## 2020-10-28 MED ORDER — ROCURONIUM BROMIDE 10 MG/ML (PF) SYRINGE
PREFILLED_SYRINGE | INTRAVENOUS | Status: DC | PRN
  Administered 2020-10-28: 60 mg via INTRAVENOUS
  Administered 2020-10-28: 20 mg via INTRAVENOUS

## 2020-10-28 NOTE — Telephone Encounter (Signed)
Patient had EBUS procedure today.   I have placed orders for PET scan and MRI brain. He does have contrast allergy and may need pretreatment protocol ordered once scheduled with radiology.   He will also need a referral to Oncology for lung cancer.   Thank you, Wille Glaser

## 2020-10-28 NOTE — Telephone Encounter (Signed)
Referral to oncology has been placed.

## 2020-10-28 NOTE — Telephone Encounter (Signed)
The MRI brain should be scheduled with anesthesia support.   Please have them speak with their primary care team for the lorazepam since they are at the [place]. If they have trouble with their primary team, then I can send in a prescription.  Thanks, Wille Glaser

## 2020-10-28 NOTE — Progress Notes (Signed)
In recovery area, the patient's o2 saturation was 90-92% on 3-4 LPM by nasal cannula. MD Erin Fulling was notified and ordered Duoneb nebulizer. Duoneb given as ordered. After Duoneb treatment, patient's o2 saturation is 91-93% on room air.  Debarah Crape, BSN, RN

## 2020-10-28 NOTE — Telephone Encounter (Signed)
Called to give pt's wife appt info:  PET 9/8 at 3:30pm; 3:00 arrival. NPO 6 HRS prior @ [facility]  MRI BRAIN 9/12 at 3:00pm; 2:30pm arrival time @ [facility]  Pt wife states pt is extremely claustrophobic. States pt had to have 3 Lorazepan before CT. She would like to know if something can be called in to the [facility] for pt to take before scans.

## 2020-10-28 NOTE — Transfer of Care (Signed)
Immediate Anesthesia Transfer of Care Note  Patient: Phillip [name] [facility]  Procedure(s) Performed: VIDEO BRONCHOSCOPY WITH ENDOBRONCHIAL ULTRASOUND BRONCHIAL NEEDLE ASPIRATION BIOPSIES BRONCHIAL BRUSHINGS BRONCHIAL WASHINGS  Patient Location: Endoscopy Unit  Anesthesia Type:General  Level of Consciousness: awake  Airway & Oxygen Therapy: Patient Spontanous Breathing and Patient connected to nasal cannula oxygen  Post-op Assessment: Report given to RN  Post vital signs: Reviewed and stable  Last Vitals:  Vitals Value Taken Time  BP    Temp    Pulse    Resp    SpO2      Last Pain:  Vitals:   10/28/20 0606  TempSrc:   PainSc: 0-No pain         Complications: No notable events documented.

## 2020-10-28 NOTE — Interval H&P Note (Signed)
History and Physical Interval Note:  10/28/2020 7:19 AM  Phillip [name]  has presented today for surgery, with the diagnosis of lung mass, mediastinal adenopathy.  The various methods of treatment have been discussed with the patient and family. After consideration of risks, benefits and other options for treatment, the patient has consented to  Procedure(s): [place] (N/A) as a surgical intervention.  The patient's history has been reviewed, patient examined, no change in status, stable for surgery.  I have reviewed the patient's chart and labs.  Questions were answered to the patient's satisfaction.     Freddi Starr

## 2020-10-28 NOTE — Telephone Encounter (Signed)
JD please advise on the meds for claustrophobic.  Thanks

## 2020-10-28 NOTE — Anesthesia Procedure Notes (Signed)
Procedure Name: Intubation Date/Time: 10/28/2020 7:50 AM Performed by: Barrington Ellison, CRNA Pre-anesthesia Checklist: Patient identified, Emergency Drugs available, Suction available and Patient being monitored Patient Re-evaluated:Patient Re-evaluated prior to induction Oxygen Delivery Method: Circle System Utilized Preoxygenation: Pre-oxygenation with 100% oxygen Induction Type: IV induction Ventilation: Mask ventilation without difficulty Laryngoscope Size: Mac and 3 Grade View: Grade I Tube type: Oral Tube size: 8.5 mm Number of attempts: 1 Airway Equipment and Method: Stylet and Oral airway Placement Confirmation: ETT inserted through vocal cords under direct vision, positive ETCO2 and breath sounds checked- equal and bilateral Secured at: 22 cm Tube secured with: Tape Dental Injury: Teeth and Oropharynx as per pre-operative assessment

## 2020-10-28 NOTE — Op Note (Signed)
Flexible and EBUS Bronchoscopy Procedure Note  Phillip [name]  [id number]  06/05/1955  Date:10/28/20  Time:9:22 AM   Provider Performing:Elliott Quade B Baeleigh Devincent   Procedure: Flexible bronchoscopy and EBUS Bronchoscopy  Indication(s) Mediastinal Adenopathy and Right Lower Lobe Mass  Consent Risks of the procedure as well as the alternatives and risks of each were explained to the patient and/or caregiver.  Consent for the procedure was obtained.  Anesthesia General Anesthesia   Time Out Verified patient identification, verified procedure, site/side was marked, verified correct patient position, special equipment/implants available, medications/allergies/relevant history reviewed, required imaging and test results available.   Sterile Technique Usual hand hygiene, masks, gowns, and gloves were used   Procedure Description Diagnostic bronchoscope advanced through endotracheal tube and into airway.  Airways were examined down to subsegmental level with findings noted below.  Following diagnostic evaluation, the diagnostic bronchoscope was then removed and the EBUS bronchoscope was advanced into airway with stations 7 and 11R were biopsied and sent for slide, cell block, and/or culture. After TBNA of the 11R lymph node there was bleeding which was controlled with 2 aliquots of cold saline. Blood and clots were then suctioned to clarity with the diagnostic bronchoscope. I then performed brushings of the right lower lobe superior segment for cytology, followed by a BAL of the right lower lobe superior segment for cytology and cultures.  Findings: - Saber sheath trachea with narrowing of the subsegmental bronchi - Mediastinal adenopathy - No significant mucosal erythema or abnormalities throughout.  - No fullness of the main carina or other secondary carinas   Complications/Tolerance None; patient tolerated the procedure well. Chest X-ray is needed post  procedure.   EBL 20-67m   Specimen(s) LN Station 7 - TBNA for pathology and cell block LN Station 11R - TBNA for pathology and cell block Brushings of RLLL, Superior segment - Cytology BAL of RLLL, Superior Segment - Cytology and Cultures (AFB, anaerobic/aerobic, fungal and general gram stain and culture)

## 2020-10-29 LAB — ACID FAST SMEAR (AFB, MYCOBACTERIA): Acid Fast Smear: NEGATIVE

## 2020-10-29 NOTE — Telephone Encounter (Signed)
JD I called the pt and he is aware of your recs.  He stated that his PCP is really hard to get things from but he will try.    He stated that he did have the CT scan done on 08/25 and he stated that he started out with a spot on his leg and now he has a rash all over and is itching.  He is concerned about having these other scans done with the dye.  He stated that they told him that they were going to premedicate him but he stated that he cannot take prednisone.  He is worried that the rash may get worse or something else may happen if he has the other scans.  Please advise. Thanks

## 2020-10-29 NOTE — Anesthesia Postprocedure Evaluation (Signed)
Anesthesia Post Note  Patient: Emon Lance [facility]  Procedure(s) Performed: VIDEO BRONCHOSCOPY WITH ENDOBRONCHIAL ULTRASOUND BRONCHIAL NEEDLE ASPIRATION BIOPSIES BRONCHIAL BRUSHINGS BRONCHIAL WASHINGS     Patient location during evaluation: PACU Anesthesia Type: General Level of consciousness: sedated Pain management: pain level controlled Vital Signs Assessment: post-procedure vital signs reviewed and stable Respiratory status: spontaneous breathing and respiratory function stable Cardiovascular status: stable Postop Assessment: no apparent nausea or vomiting Anesthetic complications: no   No notable events documented.  Last Vitals:  Vitals:   10/28/20 1023 10/28/20 1033  BP: (!) 142/72 (!) 142/66  Pulse: 75 71  Resp: 19 (!) 25  Temp:    SpO2: 97% 93%    Last Pain:  Vitals:   10/28/20 1033  TempSrc:   PainSc: 0-No pain   Pain Goal:                   Merlinda Frederick

## 2020-10-30 LAB — CULTURE, RESPIRATORY W GRAM STAIN: Culture: NO GROWTH

## 2020-11-01 ENCOUNTER — Encounter (HOSPITAL_COMMUNITY): Payer: Self-pay | Admitting: Pulmonary Disease

## 2020-11-02 LAB — AEROBIC/ANAEROBIC CULTURE W GRAM STAIN (SURGICAL/DEEP WOUND)
Culture: NO GROWTH
Gram Stain: NONE SEEN

## 2020-11-02 MED ORDER — LORAZEPAM 1 MG PO TABS: 1.0000 mg | ORAL_TABLET | ORAL | 0 refills | Status: DC | PRN

## 2020-11-02 MED ORDER — LORAZEPAM 1 MG PO TABS
ORAL_TABLET | ORAL | 0 refills | Status: DC
Start: 2020-11-02 — End: 2020-11-02

## 2020-11-02 NOTE — Telephone Encounter (Signed)
Called and spoke with patient, he was unsure if he had a reaction to MRI contrast in the past.  Advised I would make Dr. Erin Fulling aware to see how he wants to proceed.  I advised patient that CT contrast and MRI contrast are completely different.  I also advised him the he would not need to be premedicated for the PET scan as contrast is not used, they use radioactive sugar water.  He verbalized understanding.  I verified patient pharmacy and script pended for Dr. Valeta Harms to sign to be sent to [place].

## 2020-11-02 NOTE — Telephone Encounter (Signed)
Patient should follow through with the scan and take the pre-treatment recommended by the radiology team. Pretreatment usually includes steroids and benadryl. The radiology department could also consider decadron or methylprednisolone as alternatives to prednisone.  Please send in lorazepam 1mg  tablet x 16 tablets. Take 1-2 tablets every 4hrs as needed for anxiety.   Thanks, Wille Glaser

## 2020-11-02 NOTE — Telephone Encounter (Signed)
Called and spoke with Thurmond Butts with [facility] at [facility], he states that their PET scans are done in a truck and that anesthesia would not be there.  He stated that there would be no need for pre medication as they do not use contrast, they inject radioactive sugar water.  He transferred me to MRI, however, no one answered.  I will attempted to reach them again later.

## 2020-11-02 NOTE — Addendum Note (Signed)
Addended by: Vanessa Barbara on: 11/02/2020 01:00 PM   Modules accepted: Orders

## 2020-11-02 NOTE — Telephone Encounter (Signed)
Called and spoke with patient, he states he was not able to get the script from his PCP because he does not have a local PCP, his is with the [place].  He is also worried about the medication he is going to be given for the scan, he received a call from [facility] and he asked about the medication and was told it was prednisone.  He told them he was highly allergic to prednisone.  He was told they would put a note in regarding the allergy.  He then received a call this am asking whether he would be keeping the appointment or not.  Dr. Erin Fulling, please advise.  Thank you.

## 2020-11-02 NOTE — Telephone Encounter (Signed)
Rx signed and faxed to the [facility] in [place] at the number listed below

## 2020-11-02 NOTE — Telephone Encounter (Signed)
Patient states not able to get med from Primary Care office. Pharmacy is the [place]. Patient allergic to contrast. Patient phone number is [phone].

## 2020-11-02 NOTE — Telephone Encounter (Signed)
Tried calling in lorazepam as ordered by Dr Erin Fulling- [facility] not [facility] over [facility], needs to be faxed or hand delivered. Their fax number is [phone]. Rx printed to [facility] to sign, as she is the APP of the day. Thanks.

## 2020-11-03 ENCOUNTER — Telehealth: Payer: Self-pay | Admitting: Pulmonary Disease

## 2020-11-03 NOTE — Telephone Encounter (Signed)
JD pt is calling to get the results of the biopsy.  Please advise  thanks

## 2020-11-04 ENCOUNTER — Ambulatory Visit (HOSPITAL_COMMUNITY): Payer: No Typology Code available for payment source

## 2020-11-04 ENCOUNTER — Telehealth: Payer: Self-pay | Admitting: Pulmonary Disease

## 2020-11-04 LAB — CYTOLOGY - NON PAP

## 2020-11-04 NOTE — Telephone Encounter (Signed)
noted 

## 2020-11-04 NOTE — Telephone Encounter (Signed)
Received call report from Dr. Vic Ripper with pathology on patient's right lower lung brushing done on 10/28/20. Dr. Erin Fulling please review the result/impression copied below:  Patient results showing positive malignancy non small cell carcinoma.    Please advise, thank you.

## 2020-11-04 NOTE — Telephone Encounter (Signed)
I spoke with the patient and his wife. I informed them that he has non-small cell carcinoma of the lung.   He was scheduled for a PET scan but this was cancelled by the patient due to his contrast allergy.   He is scheduled for Brain MRI and a prescription for ativan was sent to his [place] to undergo this scan.   Referral to oncology has been placed.  Freda Jackson, MD [facility] Office: [phone]   See Amion for personal pager PCCM on call pager [phone] until 7pm. Please call Elink 7p-7a. [phone]

## 2020-11-08 ENCOUNTER — Ambulatory Visit (HOSPITAL_COMMUNITY): Payer: No Typology Code available for payment source

## 2020-11-11 ENCOUNTER — Other Ambulatory Visit: Payer: Self-pay | Admitting: *Deleted

## 2020-11-11 ENCOUNTER — Telehealth: Payer: Self-pay

## 2020-11-11 NOTE — Progress Notes (Signed)
The proposed treatment discussed in conference is for discussion purpose only and is not a binding recommendation.  The patients have not been physically examined, or presented with their treatment options.  Therefore, final treatment plans cannot be decided.  

## 2020-11-11 NOTE — Telephone Encounter (Signed)
Noted. Will forward to provider. Thanks :)

## 2020-11-11 NOTE — Telephone Encounter (Signed)
PCC's can we check on the status of this for Dr. Erin Fulling please   Type Date User Summary Attachment  General 10/28/2020  4:09 PM Marchelle Folks [facility] cant accept this referral until Pandora Leiter is received but the auth will go to them not [place] they state they will look in the system for a order when they receive the auth

## 2020-11-11 NOTE — Telephone Encounter (Signed)
-----   Message from Freddi Starr, MD sent at 11/10/2020  4:24 PM EDT ----- Regarding: Oncology Appointment Does this patient have an oncology appointment yet?  Thanks, Wille Glaser

## 2020-11-11 NOTE — Telephone Encounter (Signed)
[facility] there is a note in care everywhere from 10/28/20 they are talking about hematology/oncology this will get sent directly to that dept or the patient will be treated at the [facility] for if that is what they say needs to happen

## 2020-11-12 NOTE — Telephone Encounter (Signed)
This is part of the note that was in care everywhere

## 2020-11-12 NOTE — Telephone Encounter (Signed)
I called and spoke with wife Phillip [name] regarding ref for oncology. Looks like it was sent to [place] and the oncology is awaiting for a prior authorization from [place]. Wife verbalized understanding, nothing further needed.

## 2020-11-12 NOTE — Telephone Encounter (Signed)
Aldona Bar wife is checking on patient's referral to the Oncology Department. Samantha phone number is [phone] c or [phone] h.

## 2020-11-21 DIAGNOSIS — C3431 Malignant neoplasm of lower lobe, right bronchus or lung: Secondary | ICD-10-CM | POA: Insufficient documentation

## 2020-11-21 NOTE — Progress Notes (Signed)
Phillip [name] [address], [place] [address]   CLINIC:  Medical Oncology/Hematology  CONSULT NOTE  Patient Care Team: Benita Stabile, MD as PCP - General (Internal Medicine) Doreatha Massed, MD as Medical Oncologist (Oncology) Therese Sarah, RN as Oncology Nurse Navigator (Oncology)  CHIEF COMPLAINTS/PURPOSE OF CONSULTATION:  Evaluation of lung cancer  HISTORY OF PRESENTING ILLNESS:  Phillip [name] 65 y.o. male is here because of evaluation of lung cancer.  Today he reports feeling well, and he is accompanied by his wife. He reports SOB and fatigue since the end of April. He has right chest pain and a headache while coughing; the cough is productive of bloody sputum which was worsened following the bronchoscopy. He reports a history of nosebleeds intermittently since childhood. He also reports sore throat since bronchoscopy. His baseline weight is 180, and he currently weighs 171. His appetite is poor but improving. His energy levels are low, and he is able to do most of his typical at-home activities. He reports an allergy to CT scan IV dye and prednisone which causes rash and hives. He also reports an allergy to strawberries. He has clubbing of the nails which he reports has been present for many years. He has smoked 2 ppd for 40 years, although he reports he currently smokes 1/2 ppd. 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 He denies any family history of cancer.   MEDICAL HISTORY:  Past Medical History:  Diagnosis Date   Anxiety    Arthritis    Coronary artery disease    Emphysema lung (HCC)    Myocardial infarction (HCC)    Pulmonary fibrosis (HCC)     SURGICAL HISTORY: Past Surgical History:  Procedure Laterality Date   BRONCHIAL BRUSHINGS  10/28/2020   Procedure: BRONCHIAL BRUSHINGS;  Surgeon: Martina Sinner, MD;  Location[facility] ENDOSCOPY;  Service: Pulmonary;;   BRONCHIAL  NEEDLE ASPIRATION BIOPSY  10/28/2020   Procedure: BRONCHIAL NEEDLE ASPIRATION BIOPSIES;  Surgeon: Martina Sinner, MD;  Location: [facility] ENDOSCOPY;  Service: Pulmonary;;   BRONCHIAL WASHINGS  10/28/2020   Procedure: BRONCHIAL WASHINGS;  Surgeon: Martina Sinner, MD;  Location: [facility] ENDOSCOPY;  Service: Pulmonary;;   NO PAST SURGERIES     VIDEO BRONCHOSCOPY WITH ENDOBRONCHIAL ULTRASOUND N/A 10/28/2020   Procedure: VIDEO BRONCHOSCOPY WITH ENDOBRONCHIAL ULTRASOUND;  Surgeon: Martina Sinner, MD;  Location: [facility] ENDOSCOPY;  Service: Pulmonary;  Laterality: N/A;    SOCIAL HISTORY: Social History   Socioeconomic History   Marital status: Married    Spouse name: Not on file   Number of children: Not on file   Years of education: Not on file   Highest education level: Not on file  Occupational History   Not on file  Tobacco Use   Smoking status: Some Days    Packs/day: 0.50    Types: Cigarettes    Start date: 02/28/1980   Smokeless tobacco: Never  Substance and Sexual Activity   Alcohol use: Not Currently   Drug use: Never   Sexual activity: Not on file  Other Topics Concern   Not on file  Social History Narrative   Not on file   Social Determinants of Health   Financial Resource Strain: Medium Risk   Difficulty of Paying Living Expenses: Somewhat hard  Food Insecurity: No Food Insecurity   Worried About Running Out of Food in the Last Year: Never true   Ran Out  of Food in the Last Year: Never true  Transportation Needs: No Transportation Needs   Lack of Transportation (Medical): No   Lack of Transportation (Non-Medical): No  Physical Activity: Inactive   Days of Exercise per Week: 0 days   Minutes of Exercise per Session: 0 min  Stress: Stress Concern Present   Feeling of Stress : To some extent  Social Connections: Moderately Isolated   Frequency of Communication with Friends and Family: Three times a week   Frequency of Social Gatherings with Friends and Family: Three times a  week   Attends Religious Services: Never   Active Member of Clubs or Organizations: No   Attends Archivist Meetings: Never   Marital Status: Married  Human resources officer Violence: Not At Risk   Fear of Current or Ex-Partner: No   Emotionally Abused: No   Physically Abused: No   Sexually Abused: No    FAMILY HISTORY: History reviewed. No pertinent family history.  ALLERGIES:  is allergic to iodinated diagnostic agents and prednisone.  MEDICATIONS:  Current Outpatient Medications  Medication Sig Dispense Refill   aspirin EC 81 MG tablet Take 81 mg by mouth daily.     atorvastatin (LIPITOR) 80 MG tablet Take 80 mg by mouth at bedtime.     fluocinonide cream (LIDEX) 1.76 % Apply 1 application topically daily as needed (rash). Apply a small amount as needed.     Metoprolol Succinate 100 MG CS24 Take 50 mg by mouth daily.     nitroGLYCERIN (NITROSTAT) 0.4 MG SL tablet Place 0.4 mg under the tongue every 5 (five) minutes as needed for chest pain.     Tiotropium Bromide Monohydrate (SPIRIVA RESPIMAT) 2.5 MCG/ACT AERS Inhale 2 puffs into the lungs daily. 4 g 0   valsartan (DIOVAN) 160 MG tablet Take 80 mg by mouth daily.     No current facility-administered medications for this visit.    REVIEW OF SYSTEMS:   Review of Systems  Constitutional:  Positive for appetite change (60%), fatigue (25%) and unexpected weight change.  HENT:   Positive for nosebleeds, sore throat and trouble swallowing.   Respiratory:  Positive for cough, hemoptysis and shortness of breath.   Cardiovascular:  Positive for chest pain (4/10 R side).  Neurological:  Positive for headaches.  Psychiatric/Behavioral:  Positive for sleep disturbance. The patient is nervous/anxious.   All other systems reviewed and are negative.   PHYSICAL EXAMINATION: ECOG PERFORMANCE STATUS: 1 - Symptomatic but completely ambulatory  Vitals:   11/22/20 0803  BP: (!) 159/81  Pulse: 66  Resp: 18  Temp: (!) 96.9 F (36.1  C)  SpO2: 94%   Filed Weights   11/22/20 0803  Weight: 171 lb 3.2 oz (77.7 kg)   Physical Exam Vitals reviewed.  Constitutional:      Appearance: Normal appearance.  Cardiovascular:     Rate and Rhythm: Normal rate and regular rhythm.     Pulses: Normal pulses.     Heart sounds: Normal heart sounds.  Pulmonary:     Effort: Pulmonary effort is normal.     Breath sounds: Normal breath sounds.  Chest:  Breasts:    Right: No tenderness.  Abdominal:     Palpations: Abdomen is soft. There is no hepatomegaly, splenomegaly or mass.     Tenderness: There is no abdominal tenderness.  Musculoskeletal:     Right lower leg: No edema.     Left lower leg: No edema.  Skin:    Nails: There is clubbing.  Neurological:     General: No focal deficit present.     Mental Status: He is alert and oriented to person, place, and time.  Psychiatric:        Mood and Affect: Mood normal.        Behavior: Behavior normal.     LABORATORY DATA:  I have reviewed the data as listed CBC Latest Ref Rng & Units 10/28/2020  WBC 4.0 - 10.5 K/uL 7.8  Hemoglobin 13.0 - 17.0 g/dL 14.0  Hematocrit 39.0 - 52.0 % 43.2  Platelets 150 - 400 K/uL 201   CMP Latest Ref Rng & Units 10/28/2020 04/05/2018  Glucose 70 - 99 mg/dL 99 -  BUN 8 - 23 mg/dL 14 -  Creatinine 0.61 - 1.24 mg/dL 0.84 -  Sodium 135 - 145 mmol/L 135 -  Potassium 3.5 - 5.1 mmol/L 3.9 -  Chloride 98 - 111 mmol/L 102 -  CO2 22 - 32 mmol/L 26 -  Calcium 8.9 - 10.3 mg/dL 8.7(L) -  Total Protein 6.0 - 8.5 g/dL - 7.4  Total Bilirubin 0.0 - 1.2 mg/dL - 0.5  Alkaline Phos 39 - 117 IU/L - 87  AST 0 - 40 IU/L - 15  ALT 0 - 44 IU/L - 14    RADIOGRAPHIC STUDIES: I have personally reviewed the radiological images as listed and agreed with the findings in the report. DG CHEST PORT 1 VIEW  Result Date: 10/28/2020 CLINICAL DATA:  Status post bronchoscopy, cough. EXAM: PORTABLE CHEST 1 VIEW COMPARISON:  February 16, 2005. FINDINGS: The heart size and  mediastinal contours are within normal limits. Narrowing of the trachea is noted in saber sheath configuration suggesting COPD. No pneumothorax or pleural effusion is noted. Diffuse patchy and interstitial opacities are noted throughout both lungs concerning for multifocal pneumonia or possibly chronic interstitial lung disease. The visualized skeletal structures are unremarkable. IMPRESSION: Diffuse bilateral lung opacities are noted concerning for possible chronic interstitial lung disease or multifocal pneumonia. Tracheal narrowing is noted suggestive of COPD. Electronically Signed   By: Marijo Conception M.D.   On: 10/28/2020 10:35    ASSESSMENT:  Clinical stage IIIb (T3N2) non-small cell lung cancer: - Presentation with worsening shortness of breath and decreased energy level since April.  CT chest with contrast on 10/19/2020 with 6.9 x 4.0 x 6.1 cm mass lesion, thick-walled cavitary in the right lower lobe superior segment abutting the pleura.  Mild peribronchovascular nodularity tracking centrally towards the hilum.  Right upper paratracheal lymph node measures 1.0.  Precarinal lymph node measures 1.4 cm.  Subcarinal lymph node measures 1.9 cm.  No bone lesions. - Seen by Dr. Dewald-bronchoscopy and EBUS on 10/28/2020 with no endobronchial lesions. - Pathology station 7 lymph node and 11R node FNA negative for malignant cells.  Right lower lobe brushing positive for malignant cells consistent with non-small cell lung cancer. - He reported 16 pound weight loss since April, gained back about 7 pounds. - He does have occasional hemoptysis, more pronounced since bronchoscopy.  2.  Social/family history: - He lives at home with his wife.  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  Patient and wife think there could be a possible exposure to asbestos.  He is smoking few cigarettes daily up to 10.  He previously smoked 2 packs/day for more than 40 years. - No family history of  malignancy.   PLAN:  Clinical stage IIIb (T3 N2 M0) non-small cell lung cancer of the right  lower lobe: - We have reviewed findings on the CT scan. - I have also reviewed pathology reports with the patient and his wife. - We discussed the need for staging PET CT scan and brain MRI. - He reports that he is allergic to CT dye with rash and hives.  He reports that he is also allergic to prednisone which causes same symptoms.  He reportedly developed allergy to bone scan dye few years back which lasted up to 6 months.  It was mostly skin rash and blistering. - He also reports that he is claustrophobic.  We will obtain brain MRI without contrast because of questionable allergies.  This will be done under sedation. - We will also find out with pathology if there is enough tissue to run IHC to know the actual histology.  If he has stage IV disease, will need additional tissue for NGS and PD-L1 testing. - RTC after the scans.   All questions were answered. The patient knows to call the clinic with any problems, questions or concerns.   Derek Jack, MD, 11/22/20 4:33 PM  [place] [phone]   I, Thana Ates, am acting as a scribe for Dr. Derek Jack.  I, Derek Jack MD, have reviewed the above documentation for accuracy and completeness, and I agree with the above.

## 2020-11-22 ENCOUNTER — Other Ambulatory Visit: Payer: Self-pay

## 2020-11-22 ENCOUNTER — Inpatient Hospital Stay (HOSPITAL_COMMUNITY): Payer: No Typology Code available for payment source | Attending: Hematology | Admitting: Hematology

## 2020-11-22 ENCOUNTER — Encounter (HOSPITAL_COMMUNITY): Payer: Self-pay | Admitting: Hematology

## 2020-11-22 VITALS — BP 159/81 | HR 66 | Temp 96.9°F | Resp 18 | Ht 69.0 in | Wt 171.2 lb

## 2020-11-22 DIAGNOSIS — R634 Abnormal weight loss: Secondary | ICD-10-CM | POA: Insufficient documentation

## 2020-11-22 DIAGNOSIS — C3431 Malignant neoplasm of lower lobe, right bronchus or lung: Secondary | ICD-10-CM | POA: Insufficient documentation

## 2020-11-22 DIAGNOSIS — F17210 Nicotine dependence, cigarettes, uncomplicated: Secondary | ICD-10-CM | POA: Diagnosis not present

## 2020-11-22 DIAGNOSIS — R042 Hemoptysis: Secondary | ICD-10-CM | POA: Insufficient documentation

## 2020-11-22 DIAGNOSIS — C3490 Malignant neoplasm of unspecified part of unspecified bronchus or lung: Secondary | ICD-10-CM

## 2020-11-22 NOTE — Patient Instructions (Addendum)
[place] at [facility] Discharge Instructions  You were seen and examined today by Dr. Delton Coombes. Dr. Delton Coombes is a medical oncologist, meaning he specializes in the management of cancer diagnoses. Dr. Delton Coombes discussed your past medical history, family history of cancer and the events that led to you being here today.  You were referred to Dr. Delton Coombes by [facility] Pulmonology due to your recent abnormal biopsy. Your biopsy revealed a type of lung cancer known as non-small cell carcinoma. On your recent CT scan, a 6cm mass was noted inside your lung. There were also lymph nodes noted in your chest, which is highly concerning for cancer having spread to the lymph nodes.  Dr. Delton Coombes has recommended a PET scan. This is a specialized CT scan that illuminates where there is cancer present in the body. Dr. Delton Coombes also recommends a brain MRI. These are done for all patients with lung cancer. Dr. Delton Coombes requires this to identify the stage of your cancer.  Follow-up with Dr. Delton Coombes after your PET scan and MRI.   Thank you for choosing [place] at [facility] to provide your oncology and hematology care.  To afford each patient quality time with our provider, please arrive at least 15 minutes before your scheduled appointment time.   If you have a lab appointment with the [place] please come in thru the Main Entrance and check in at the main information desk.  You need to re-schedule your appointment should you arrive 10 or more minutes late.  We strive to give you quality time with our providers, and arriving late affects you and other patients whose appointments are after yours.  Also, if you no show three or more times for appointments you may be dismissed from the clinic at the providers discretion.     Again, thank you for choosing [facility].  Our hope is that these requests will decrease the amount of time  that you wait before being seen by our physicians.       _____________________________________________________________  Should you have questions after your visit to [facility], please contact our office at [phone] and follow the prompts.  Our office hours are 8:00 a.m. and 4:30 p.m. Monday - Friday.  Please note that voicemails left after 4:00 p.m. may not be returned until the following business day.  We are closed weekends and major holidays.  You do have access to a nurse 24-7, just call the main number to the clinic [phone] and do not press any options, hold on the line and a nurse will answer the phone.    For prescription refill requests, have your pharmacy contact our office and allow 72 hours.    Due to Covid, you will need to wear a mask upon entering the hospital. If you do not have a mask, a mask will be given to you at the Main Entrance upon arrival. For doctor visits, patients may have 1 support person age 81 or older with them. For treatment visits, patients can not have anyone with them due to social distancing guidelines and our immunocompromised population.

## 2020-11-24 ENCOUNTER — Encounter (HOSPITAL_COMMUNITY): Payer: Self-pay

## 2020-11-24 NOTE — Progress Notes (Signed)
I spoke with the Phillip [name] and his wife today regarding upcoming scans. Phillip [name] anxious about PET scan appt, but willing to try with previously prescribed anxiety medication. Discussed taking anxiety medication as prescribed 1hr prior to appt for scan. They verbalized understanding. They report MRI scheduled per Dr. Delton Coombes. Phillip [name] to follow-up following PET scan

## 2020-11-25 ENCOUNTER — Other Ambulatory Visit: Payer: Self-pay

## 2020-11-25 ENCOUNTER — Ambulatory Visit (HOSPITAL_COMMUNITY)
Admission: RE | Admit: 2020-11-25 | Discharge: 2020-11-25 | Disposition: A | Discharge status: Home / Self Care | Payer: No Typology Code available for payment source | Source: Ambulatory Visit | Attending: Pulmonary Disease | Admitting: Pulmonary Disease

## 2020-11-25 DIAGNOSIS — C3490 Malignant neoplasm of unspecified part of unspecified bronchus or lung: Secondary | ICD-10-CM | POA: Diagnosis not present

## 2020-11-25 LAB — AFB ORGANISM ID BY DNA PROBE
M avium complex: POSITIVE — AB
M tuberculosis complex: NEGATIVE

## 2020-11-25 LAB — MAC SUSCEPTIBILITY BROTH
Ciprofloxacin: >8
Clarithromycin: 4
Linezolid: >32
Rifampin: >4
Streptomycin: >32

## 2020-11-25 LAB — ACID FAST CULTURE WITH REFLEXED SENSITIVITIES (MYCOBACTERIA): Acid Fast Culture: POSITIVE — AB

## 2020-11-25 MED ORDER — FLUDEOXYGLUCOSE F - 18 (FDG) INJECTION
9.5930 | Freq: Once | INTRAVENOUS | Status: AC | PRN
  Administered 2020-11-25: 9.593 via INTRAVENOUS

## 2020-11-27 NOTE — Progress Notes (Signed)
[facility] [address], [place] [address]   CLINIC:  Medical Oncology/Hematology  PCP:  Benita Stabile, MD [address] [place] [address] [phone]   REASON FOR VISIT:  Follow-up for lung cancer  PRIOR THERAPY: none  NGS Results: not done  CURRENT THERAPY: under work-up  BRIEF ONCOLOGIC HISTORY:  Oncology History   No history exists.    CANCER STAGING: Cancer Staging Cancer of lower lobe of right lung [facility]) Staging form: Lung, AJCC 8th Edition - Clinical stage from 11/21/2020: Stage IIIB (cT3, cN2, cM0) - Unsigned   INTERVAL HISTORY:  Phillip [name], a 65 y.o. male, returns for routine follow-up of his lung cancer. Phillip [name] was last seen on 11/22/2020.   Today he reports feeling well. He reports right CP, throat pain, hemoptysis, and trouble swallowing. He expresses a preference against radiation therapy.   REVIEW OF SYSTEMS:  Review of Systems  Constitutional:  Positive for fatigue (25%). Negative for appetite change.  HENT:   Positive for sore throat (7/10) and trouble swallowing.        Right ear pain  Respiratory:  Positive for cough, hemoptysis and shortness of breath.   Cardiovascular:  Positive for chest pain (7/10 R side).  Musculoskeletal:  Positive for back pain (7/10).  Neurological:  Positive for headaches.  Psychiatric/Behavioral:  Positive for sleep disturbance.   All other systems reviewed and are negative.  PAST MEDICAL/SURGICAL HISTORY:  Past Medical History:  Diagnosis Date   Anxiety    Arthritis    Coronary artery disease    Emphysema lung (HCC)    Myocardial infarction [facility])    Pulmonary fibrosis (HCC)    Past Surgical History:  Procedure Laterality Date   BRONCHIAL BRUSHINGS  10/28/2020   Procedure: BRONCHIAL BRUSHINGS;  Surgeon: Martina Sinner, MD;  Location: [facility] ENDOSCOPY;  Service: Pulmonary;;   BRONCHIAL NEEDLE ASPIRATION BIOPSY  10/28/2020   Procedure: BRONCHIAL NEEDLE ASPIRATION BIOPSIES;   Surgeon: Martina Sinner, MD;  Location: [facility] ENDOSCOPY;  Service: Pulmonary;;   BRONCHIAL WASHINGS  10/28/2020   Procedure: BRONCHIAL WASHINGS;  Surgeon: Martina Sinner, MD;  Location: [facility] ENDOSCOPY;  Service: Pulmonary;;   NO PAST SURGERIES     VIDEO BRONCHOSCOPY WITH ENDOBRONCHIAL ULTRASOUND N/A 10/28/2020   Procedure: VIDEO BRONCHOSCOPY WITH ENDOBRONCHIAL ULTRASOUND;  Surgeon: Martina Sinner, MD;  Location: [facility] ENDOSCOPY;  Service: Pulmonary;  Laterality: N/A;    SOCIAL HISTORY:  Social History   Socioeconomic History   Marital status: Married    Spouse name: Not on file   Number of children: Not on file   Years of education: Not on file   Highest education level: Not on file  Occupational History   Not on file  Tobacco Use   Smoking status: Some Days    Packs/day: 0.50    Types: Cigarettes    Start date: 02/28/1980   Smokeless tobacco: Never  Substance and Sexual Activity   Alcohol use: Not Currently   Drug use: Never   Sexual activity: Not on file  Other Topics Concern   Not on file  Social History Narrative   Not on file   Social Determinants of Health   Financial Resource Strain: Medium Risk   Difficulty of Paying Living Expenses: Somewhat hard  Food Insecurity: No Food Insecurity   Worried About Running Out of Food in the Last Year: Never true   Ran Out of Food in the Last Year: Never true  Transportation Needs: No Data processing manager (Medical): No   Lack of Transportation (Non-Medical): No  Physical Activity: Inactive   Days of Exercise per Week: 0 days   Minutes of Exercise per Session: 0 min  Stress: Stress Concern Present   Feeling of Stress : To some extent  Social Connections: Moderately Isolated   Frequency of Communication with Friends and Family: Three times a week   Frequency of Social Gatherings with Friends and Family: Three times a week   Attends Religious Services: Never   Active Member of Clubs or Organizations:  No   Attends Archivist Meetings: Never   Marital Status: Married  Human resources officer Violence: Not At Risk   Fear of Current or Ex-Partner: No   Emotionally Abused: No   Physically Abused: No   Sexually Abused: No    FAMILY HISTORY:  No family history on file.  CURRENT MEDICATIONS:  Current Outpatient Medications  Medication Sig Dispense Refill   aspirin EC 81 MG tablet Take 81 mg by mouth daily.     atorvastatin (LIPITOR) 80 MG tablet Take 80 mg by mouth at bedtime.     fluocinonide cream (LIDEX) 2.50 % Apply 1 application topically daily as needed (rash). Apply a small amount as needed.     Metoprolol Succinate 100 MG CS24 Take 50 mg by mouth daily.     nitroGLYCERIN (NITROSTAT) 0.4 MG SL tablet Place 0.4 mg under the tongue every 5 (five) minutes as needed for chest pain.     Tiotropium Bromide Monohydrate (SPIRIVA RESPIMAT) 2.5 MCG/ACT AERS Inhale 2 puffs into the lungs daily. 4 g 0   valsartan (DIOVAN) 160 MG tablet Take 80 mg by mouth daily.     No current facility-administered medications for this visit.    ALLERGIES:  Allergies  Allergen Reactions   Iodinated Diagnostic Agents Dermatitis and Itching   Prednisone Hives and Swelling    PHYSICAL EXAM:  Performance status (ECOG): 1 - Symptomatic but completely ambulatory  There were no vitals filed for this visit. Wt Readings from Last 3 Encounters:  11/22/20 171 lb 3.2 oz (77.7 kg)  10/28/20 166 lb (75.3 kg)  10/21/20 167 lb (75.8 kg)   Physical Exam   LABORATORY DATA:  I have reviewed the labs as listed.  CBC Latest Ref Rng & Units 10/28/2020  WBC 4.0 - 10.5 K/uL 7.8  Hemoglobin 13.0 - 17.0 g/dL 14.0  Hematocrit 39.0 - 52.0 % 43.2  Platelets 150 - 400 K/uL 201   CMP Latest Ref Rng & Units 10/28/2020 04/05/2018  Glucose 70 - 99 mg/dL 99 -  BUN 8 - 23 mg/dL 14 -  Creatinine 0.61 - 1.24 mg/dL 0.84 -  Sodium 135 - 145 mmol/L 135 -  Potassium 3.5 - 5.1 mmol/L 3.9 -  Chloride 98 - 111 mmol/L 102 -  CO2  22 - 32 mmol/L 26 -  Calcium 8.9 - 10.3 mg/dL 8.7(L) -  Total Protein 6.0 - 8.5 g/dL - 7.4  Total Bilirubin 0.0 - 1.2 mg/dL - 0.5  Alkaline Phos 39 - 117 IU/L - 87  AST 0 - 40 IU/L - 15  ALT 0 - 44 IU/L - 14    DIAGNOSTIC IMAGING:  I have independently reviewed the scans and discussed with the patient. NM PET Image Initial (PI) Skull Base To Thigh  Result Date: 11/26/2020 CLINICAL DATA:  Initial treatment strategy for pulmonary mass. EXAM: NUCLEAR MEDICINE PET SKULL BASE TO THIGH TECHNIQUE: 9.6 mCi  F-18 FDG was injected intravenously. Full-ring PET imaging was performed from the skull base to thigh after the radiotracer. CT data was obtained and used for attenuation correction and anatomic localization. Fasting blood glucose: 88 mg/dl COMPARISON:  None. FINDINGS: Mediastinal blood pool activity: SUV max 2.1 Liver activity: SUV max NA NECK: No hypermetabolic lymph nodes in the neck. Incidental CT findings: none CHEST: Within the RIGHT lower lobe thick-walled cavitary mass measures 5.4 x 4.2 cm with SUV max equal 21.8. Hypermetabolic RIGHT hilar node measures approximate 1.9 cm on image 104/series 3. With SUV max equal 10.2. Hypermetabolic subcarinal node SUV max equal 6.2. No hypermetabolic supraclavicular nodes. There is a hypermetabolic RIGHT paratracheal node with SUV max equal 3.7. Within the periphery of the anterior RIGHT upper lobe there is a semi-solid nodule measuring 1.6 cm with SUV max equal 9.7 (image 79/3 Incidental CT findings: Diffuse subpleural reticulation within the lungs. ABDOMEN/PELVIS: No abnormal hypermetabolic activity within the liver, pancreas, adrenal glands, or spleen. Focal metabolic activity associated with the RIGHT adrenal gland with SUV max equal 10.3. Minimal nodular thickening on the CT portion the exam. No abnormal activity in the liver. No hypermetabolic abdominopelvic lymph nodes. Incidental CT findings: LEFT colon diverticulosis. Moderate size LEFT inguinal hernia.  SKELETON: No focal hypermetabolic activity to suggest skeletal metastasis. Incidental CT findings: none IMPRESSION: 1. Thick-walled hypermetabolic cavitary mass in the RIGHT lower lobe is most consistent with bronchogenic carcinoma (consider squamous cell carcinoma). 2. Metastatic adenopathy to subcarinal and RIGHT paratracheal nodal stations. Hypermetabolic adenopathy in the RIGHT hilum. No supraclavicular adenopathy. 3. High concern for metastatic disease to the RIGHT adrenal gland although minimal change on the CT portion of exam. 4. Hypermetabolic nodule in the RIGHT upper lobe is indeterminate with differential including pulmonary infection versus neoplasm. Electronically Signed   By: Suzy Bouchard M.D.   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  CT chest with contrast on 10/19/2020 with 6.9 x 4.0 x 6.1 cm mass lesion, thick-walled cavitary in the right lower lobe superior segment abutting the pleura.  Mild peribronchovascular nodularity tracking centrally towards the hilum.  Right upper paratracheal lymph node measures 1.0.  Precarinal lymph node measures 1.4 cm.  Subcarinal lymph node measures 1.9 cm.  No bone lesions. - Seen by Dr. Dewald-bronchoscopy and EBUS on 10/28/2020 with no endobronchial lesions. - Pathology station 7 lymph node and 11R node FNA negative for malignant cells.  Right lower lobe brushing positive for malignant cells consistent with non-small cell lung cancer. - He reported 16 pound weight loss since April, gained back about 7 pounds. - He does have occasional hemoptysis, more pronounced since bronchoscopy.  2.  Social/family history: - He lives at home with his wife.  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  Patient and wife think there could be a possible exposure to asbestos.  He is smoking few  cigarettes daily up to 10.  He previously smoked 2 packs/day for more than 40 years. - No family history of malignancy.   PLAN:  Clinical stage IIIb (T3 N2 M0) non-small cell lung cancer of the right lower lobe: -We have discussed PET scan results and I reviewed images with him. - PET scan on 11/25/2020 showed thick-walled hypermetabolic cavitary mass in the right lower lobe consistent with bronchogenic carcinoma.  Metastatic adenopathy to subcarinal, right paratracheal  and right hilum. - Hypermetabolic nodule in the right upper lobe is indeterminate, measuring 1.6 cm, infection versus neoplasm. - High concern for metastatic disease to the right adrenal gland with SUV 10.3 with minimal nodular thickening on the CT portion of the exam. - Due to discordancy, I have recommended biopsy of the right adrenal by IR.  This will help in change of therapy.  If metastatic disease is confirmed, will send NGS and PD-L1 testing on the biopsy. - We will also send guardant 360 today. - In the interest of time, I have also recommended radiation oncology consult.  He does not want to do it at this time. - RTC 3 to 5 days after biopsy to discuss results.  2.  Right chest wall pain: - He reports right-sided throat pain since the bronchoscopy. - He also reports right chest wall pain from repeated coughing.  He has mild hemoptysis. - We will start him on hydrocodone 5/325 twice daily as needed.   Orders placed this encounter:  No orders of the defined types were placed in this encounter. Total time spent is 40 minutes with more than 70% of the time spent face-to-face discussing scan results, further work-up plan, counseling and coordination of care.   Derek Jack, MD [place] [phone]   I, Thana Ates, am acting as a scribe for Dr. Derek Jack.  I, Derek Jack MD, have reviewed the above documentation for accuracy and completeness, and I agree with the above.

## 2020-11-29 ENCOUNTER — Other Ambulatory Visit (HOSPITAL_COMMUNITY): Payer: Self-pay | Admitting: *Deleted

## 2020-11-29 ENCOUNTER — Other Ambulatory Visit: Payer: Self-pay

## 2020-11-29 ENCOUNTER — Inpatient Hospital Stay (HOSPITAL_COMMUNITY): Payer: No Typology Code available for payment source | Attending: Hematology | Admitting: Hematology

## 2020-11-29 ENCOUNTER — Encounter (HOSPITAL_COMMUNITY): Payer: Self-pay | Admitting: Radiology

## 2020-11-29 VITALS — BP 165/82 | HR 66 | Temp 98.3°F | Resp 18 | Wt 169.8 lb

## 2020-11-29 DIAGNOSIS — G479 Sleep disorder, unspecified: Secondary | ICD-10-CM | POA: Diagnosis not present

## 2020-11-29 DIAGNOSIS — F17210 Nicotine dependence, cigarettes, uncomplicated: Secondary | ICD-10-CM | POA: Diagnosis not present

## 2020-11-29 DIAGNOSIS — F32A Depression, unspecified: Secondary | ICD-10-CM | POA: Diagnosis not present

## 2020-11-29 DIAGNOSIS — R059 Cough, unspecified: Secondary | ICD-10-CM | POA: Diagnosis not present

## 2020-11-29 DIAGNOSIS — R634 Abnormal weight loss: Secondary | ICD-10-CM | POA: Diagnosis not present

## 2020-11-29 DIAGNOSIS — R0789 Other chest pain: Secondary | ICD-10-CM | POA: Insufficient documentation

## 2020-11-29 DIAGNOSIS — F4310 Post-traumatic stress disorder, unspecified: Secondary | ICD-10-CM | POA: Diagnosis not present

## 2020-11-29 DIAGNOSIS — C3490 Malignant neoplasm of unspecified part of unspecified bronchus or lung: Secondary | ICD-10-CM | POA: Diagnosis present

## 2020-11-29 MED ORDER — HYDROCODONE-ACETAMINOPHEN 5-325 MG PO TABS: 1.0000 | ORAL_TABLET | Freq: Four times a day (QID) | ORAL | 0 refills | Status: DC | PRN

## 2020-11-29 NOTE — Progress Notes (Signed)
Patient Name  Phillip [name], Phillip [name] Legal Sex  Male DOB  [birth year] SSN  [id number] Address  [address] [place] [address] Phone  [phone] [facility])  [phone] (Mobile)    RE: CT Biopsy Received: Today Arne Cleveland, MD  [place]   CT core bx R adrenal gland  Lesion difficult to see on CT alone-- review PET   DDH        Previous Messages   ----- Message -----  From: Garth Bigness D  Sent: 11/29/2020  10:49 AM EDT  To: Ir Procedure Requests  Subject: CT Biopsy                                       Procedure:  CT Biopsy   Reason:  Malignant neoplasm of unspecified part of unspecified bronchus or lung, Biopsy of right adrenal gland - hx NSCLC, r/o metastatic disease, r/o metastatic non-small cell lung cancer   History:  NM PET in computer, MRI scheduled with Anesthesia on 12/09/2020   Provider:  Derek Jack   Provider Contact:  [phone]

## 2020-11-29 NOTE — Patient Instructions (Addendum)
[place] at [facility] Discharge Instructions  You were seen today by Dr. Delton Coombes. He went over your recent results and scans. You have been prescribed hydrocodone to be taken 2 times a day as needed for your pain. This medication can cause constipation. Purchase stool softener over the counter and take as needed for constipation. You will be scheduled for a biopsy of your right adrenal gland. Dr. Delton Coombes will see you back in 3 to 5 days after your biopsy for labs and follow up.   Thank you for choosing [place] at [facility] to provide your oncology and hematology care.  To afford each patient quality time with our provider, please arrive at least 15 minutes before your scheduled appointment time.   If you have a lab appointment with the [place] please come in thru the Main Entrance and check in at the main information desk  You need to re-schedule your appointment should you arrive 10 or more minutes late.  We strive to give you quality time with our providers, and arriving late affects you and other patients whose appointments are after yours.  Also, if you no show three or more times for appointments you may be dismissed from the clinic at the providers discretion.     Again, thank you for choosing [facility].  Our hope is that these requests will decrease the amount of time that you wait before being seen by our physicians.       _____________________________________________________________  Should you have questions after your visit to [facility], please contact our office at [phone] between the hours of 8:00 a.m. and 4:30 p.m.  Voicemails left after 4:00 p.m. will not be returned until the following business day.  For prescription refill requests, have your pharmacy contact our office and allow 72 hours.    Cancer Center Support Programs:   > Cancer Support Group  2nd Tuesday of the month  1pm-2pm, Journey Room

## 2020-11-30 LAB — FUNGAL ORGANISM REFLEX

## 2020-11-30 LAB — FUNGUS CULTURE WITH STAIN

## 2020-11-30 LAB — FUNGUS CULTURE RESULT

## 2020-12-01 ENCOUNTER — Telehealth (HOSPITAL_COMMUNITY): Payer: Self-pay | Admitting: *Deleted

## 2020-12-01 ENCOUNTER — Other Ambulatory Visit (HOSPITAL_COMMUNITY): Payer: Self-pay | Admitting: Physician Assistant

## 2020-12-01 ENCOUNTER — Telehealth: Payer: Self-pay | Admitting: Pulmonary Disease

## 2020-12-01 NOTE — Telephone Encounter (Signed)
All patients who are undergoing biopsy for concern of lung cancer are having Guardant 360 labs drawn per protocol. The results should be available for the Oncology team to help with further diagnosis and treatment planning.   Dr. Valeta Harms may be able to comment further on this testing. I do not see the results in the patient's chart.   Thanks, Wille Glaser

## 2020-12-01 NOTE — Telephone Encounter (Signed)
Called and spoke with patient. He stated that he had his bronch on 10/28/20 and was told that a Guardant 360 test was collected. He was shown the box before his procedure. He never heard back about the results. He established with oncology last week and they asked him about the results as well. Per patient, the oncologist stated that since they do not have those results, he will have to have another biopsy.   I did a quick search of his chart and could not find any mentioning of the Guardant test. The only time it popped up in his chart and was when it was mentioned by the oncologist on 11/29/20.  He is aware that I will research this and call him as soon as I find anything.   He also stated that he is still coughing up small red chunks of blood and his throat is still sore where the sample was collected.   Dr. Erin Fulling, do you remember anything about a Guardant 360 test being done?

## 2020-12-01 NOTE — Telephone Encounter (Signed)
States he is still coughing up blood some and his throat has not been right since

## 2020-12-01 NOTE — Telephone Encounter (Signed)
Received TC from patient stating that he does not wish to proceed with CT biopsy scheduled for Friday October 7th.  Will keep MRI of brain appointment in the event that studies come back and he will decide at that time wether to proceed.   States that he would like to await results of Guardant testing & see Dr Delton Coombes in the office to discuss options based off of results.  CT Biopsy will be cancelled at this time and f/u appointment will be made.

## 2020-12-02 ENCOUNTER — Telehealth (HOSPITAL_COMMUNITY): Payer: Self-pay | Admitting: *Deleted

## 2020-12-02 ENCOUNTER — Other Ambulatory Visit: Payer: Self-pay | Admitting: Radiology

## 2020-12-02 NOTE — Telephone Encounter (Signed)
Patient called to cancel MRI of brain as well.  Will discuss Guardant results at follow up and plan accordingly.

## 2020-12-03 ENCOUNTER — Ambulatory Visit (HOSPITAL_COMMUNITY): Admission: RE | Admit: 2020-12-03 | Payer: No Typology Code available for payment source | Source: Ambulatory Visit

## 2020-12-03 NOTE — Telephone Encounter (Signed)
Patient is checking on lab results. Patient phone number is [phone].

## 2020-12-03 NOTE — Telephone Encounter (Addendum)
Called and spoke with pt letting him know that I spoke with Dr. Valeta Harms, another pulmonologist at our office who has spoken with Dr. Erin Fulling about case. Stated to him that per Dr. Valeta Harms, Oncology should have the results of the lab test that was done the day of the bronch. While speaking with pt, pt placed me on hold to try to get in touch with oncologist about the results and when pt got back on the phone, he stated that he had to leave a message for oncology to call him back.  While speaking with pt, pt stated that he has been coughing up blood since the bronch which was performed 9/1. Stated to pt that we could get him in for an appt and pt stated that he did not want to schedule an appt with our office and also pt was threatening multiple times during the conversation. Mychart message sent to pt after I had spoken with director who also called and spoke with pt stating to call office Monday, 10/10 to ask to speak with me personally if he wanted to schedule an appt Tuesday, 10/11 with Dr. Erin Fulling.

## 2020-12-06 ENCOUNTER — Telehealth: Payer: Self-pay | Admitting: Pulmonary Disease

## 2020-12-06 ENCOUNTER — Encounter (HOSPITAL_COMMUNITY): Payer: Self-pay

## 2020-12-06 DIAGNOSIS — R070 Pain in throat: Secondary | ICD-10-CM

## 2020-12-06 NOTE — Addendum Note (Signed)
Addended by: Lorretta Harp on: 12/06/2020 11:47 AM   Modules accepted: Orders

## 2020-12-06 NOTE — Telephone Encounter (Signed)
I spoke with patient on the phone regarding his concerns of cough and hemoptysis. He complains of throat discomfort with daily cough and blood in his sputum. He has been offered a clinic visit tomorrow 10/11 at 1:45pm with me for further assessment and treatment but he reports he can not make the appointment due to other doctor's appointments. I offered to fit him into my clinic schedule on Wednesday or Thursday 10/12 or 10/13 and he again said he could not make it in to be evaluated those days. He is upset with our clinic and said he did not want to come in again. I explained to him that the hemoptysis is likely coming from the cancer where I performed the brushing of the airway that leads to his mass and less likely from the lymph node stations that I sampled via [facility].   He agreed to an ENT referral to be evaluated for the throat pain.   He expressed his dissatisfaction with our clinic about receiving his results and issues with the Guardant 360 lab draws. He reports he had this lab drawn two times (at time of bronchoscopy and again on 10/3 in the Oncology clinic).  He led a very circular conversation bringing up previous complaints/issues of his experience with our clinic thus far and the healthcare he has received. He had a hard time moving forward with future planning of his care. He was threatening during parts of the conversation despite being offered multiple solutions to move forward with treating his symptoms.   Freda Jackson, MD [facility] Office: [phone]   See Amion for personal pager PCCM on call pager [phone] until 7pm. Please call [place] 7p-7a. [phone]

## 2020-12-06 NOTE — Telephone Encounter (Signed)
Referral to ENT has been placed 

## 2020-12-06 NOTE — Telephone Encounter (Signed)
Pt called office back and I spoke with him. Asked pt if he heard from Oncology about the results from the lab test. Pt said that he called them again today, 10/10 and when speaking with pt, they were able to give him the results from the bloodwork that they had drawn at the office but this was not the results from the bloodwork that was drawn the day pt had the bronch performed 9/1 as pt said they never received those results and do not know what had happened with those results.  While speaking with pt, checked to see if I could schedule an appt with JD tomorrow 10/11 but pt said that he could not come in tomorrow but could come in today, 10/10. JD not in office. Checked with BI who knows some about pt but he did not have any openings and said to reach out to [place] to have him call pt. Spoke with JD who said he will call pt and if he says that he can come in tomorrow for an appt, let JD know that I did hold his 1:45 slot tomorrow 10/11 if pt will agree to be seen and stated to him to let me know what pt says after speaking with him and JD verbalized understanding.

## 2020-12-06 NOTE — Progress Notes (Signed)
Guardant 360 Results received. Placed in chart. Patient scheduled to see Dr. Delton Coombes on 10/13 to discuss results.

## 2020-12-07 NOTE — Progress Notes (Addendum)
When I told Mr, [name] where I was calling from he said, "who do I have to talk to that will get that the MRI has been cancelled, I spoke with someone with any Oncology, they said they would get it cancelled, I spoke to some man who called yesterday, he said he would see that it is cancelled.  I apologized to Phillip [name]. I sent an Email to Jenetta Downer in MRI scheduling and told her that MRI needs to be cancelled, that Phillip [name] has told  several that the MRI is being cancelled. I sent Adonis Huguenin, RN, from the [facility] a message and asked her to have it cancelled. [name] has notes in Phillip [name] , one states patient has an appointment on 12/09/20 with Dr. Raliegh Ip.

## 2020-12-08 NOTE — Progress Notes (Signed)
[address], [place] [address]   CLINIC:  Medical Oncology/Hematology  PCP:  Celene Squibb, MD [address] [address] [phone]   REASON FOR VISIT:  Follow-up for lung cancer  PRIOR THERAPY: none  NGS Results: not done  CURRENT THERAPY: under work-up  BRIEF ONCOLOGIC HISTORY:  Oncology History  Cancer of lower lobe of right lung ([place])  11/05/2020 Genetic Testing   Guardant 360 Results       CANCER STAGING: Cancer Staging Cancer of lower lobe of right lung ([place]) Staging form: Lung, AJCC 8th Edition - Clinical stage from 11/21/2020: Stage IIIB (cT3, cN2, cM0) - Unsigned   INTERVAL HISTORY:  Phillip [name], a 65 y.o. male, returns for routine follow-up of his lung cancer. Gee was last seen on 11/29/2020.   Today he reports feeling well. He reports increased anxiety and continued pain in his throat. His appetite has improved. He reports right chest pain while coughing for which he takes 1 hydrocodone daily.   REVIEW OF SYSTEMS:  Review of Systems  Constitutional:  Negative for appetite change and fatigue (50%).  HENT:   Positive for sore throat (5/10).   Respiratory:  Positive for cough and shortness of breath.   Cardiovascular:  Positive for chest pain (R side).  Psychiatric/Behavioral:  Positive for depression and sleep disturbance. The patient is nervous/anxious.        PTSD  All other systems reviewed and are negative.  PAST MEDICAL/SURGICAL HISTORY:  Past Medical History:  Diagnosis Date   Anxiety    Arthritis    Coronary artery disease    Emphysema lung (HCC)    Myocardial infarction [facility])    Pulmonary fibrosis ([place])    Past Surgical History:  Procedure Laterality Date   BRONCHIAL BRUSHINGS  10/28/2020   Procedure: BRONCHIAL BRUSHINGS;  Surgeon: Freddi Starr, MD;  Location: [facility] ENDOSCOPY;  Service: Pulmonary;;   BRONCHIAL NEEDLE ASPIRATION BIOPSY  10/28/2020   Procedure: BRONCHIAL  NEEDLE ASPIRATION BIOPSIES;  Surgeon: Freddi Starr, MD;  Location: [facility] ENDOSCOPY;  Service: Pulmonary;;   BRONCHIAL WASHINGS  10/28/2020   Procedure: BRONCHIAL WASHINGS;  Surgeon: Freddi Starr, MD;  Location: [facility] ENDOSCOPY;  Service: Pulmonary;;   NO PAST SURGERIES     VIDEO BRONCHOSCOPY WITH ENDOBRONCHIAL ULTRASOUND N/A 10/28/2020   Procedure: VIDEO BRONCHOSCOPY WITH ENDOBRONCHIAL ULTRASOUND;  Surgeon: Freddi Starr, MD;  Location: [facility] ENDOSCOPY;  Service: Pulmonary;  Laterality: N/A;    SOCIAL HISTORY:  Social History   Socioeconomic History   Marital status: Married    Spouse name: Not on file   Number of children: Not on file   Years of education: Not on file   Highest education level: Not on file  Occupational History   Not on file  Tobacco Use   Smoking status: Some Days    Packs/day: 0.50    Types: Cigarettes    Start date: 02/28/1980   Smokeless tobacco: Never  Substance and Sexual Activity   Alcohol use: Not Currently   Drug use: Never   Sexual activity: Not on file  Other Topics Concern   Not on file  Social History Narrative   Not on file   Social Determinants of Health   Financial Resource Strain: Medium Risk   Difficulty of Paying Living Expenses: Somewhat hard  Food Insecurity: No Food Insecurity   Worried About Running Out of Food in the Last Year: Never true  Ran Out of Food in the Last Year: Never true  Transportation Needs: No Transportation Needs   Lack of Transportation (Medical): No   Lack of Transportation (Non-Medical): No  Physical Activity: Inactive   Days of Exercise per Week: 0 days   Minutes of Exercise per Session: 0 min  Stress: Stress Concern Present   Feeling of Stress : To some extent  Social Connections: Moderately Isolated   Frequency of Communication with Friends and Family: Three times a week   Frequency of Social Gatherings with Friends and Family: Three times a week   Attends Religious Services: Never   Active Member  of Clubs or Organizations: No   Attends Archivist Meetings: Never   Marital Status: Married  Human resources officer Violence: Not At Risk   Fear of Current or Ex-Partner: No   Emotionally Abused: No   Physically Abused: No   Sexually Abused: No    FAMILY HISTORY:  No family history on file.  CURRENT MEDICATIONS:  Current Outpatient Medications  Medication Sig Dispense Refill   aspirin EC 81 MG tablet Take 81 mg by mouth daily.     atorvastatin (LIPITOR) 80 MG tablet Take 80 mg by mouth at bedtime.     fluocinonide cream (LIDEX) 4.33 % Apply 1 application topically daily as needed (rash). Apply a small amount as needed.     HYDROcodone-acetaminophen (NORCO) 5-325 MG tablet Take 1 tablet by mouth every 6 (six) hours as needed for moderate pain. 30 tablet 0   metoprolol succinate (TOPROL-XL) 100 MG 24 hr tablet Take 50 mg by mouth daily. Take with or immediately following a meal.     nitroGLYCERIN (NITROSTAT) 0.4 MG SL tablet Place 0.4 mg under the tongue every 5 (five) minutes as needed for chest pain.     Tiotropium Bromide Monohydrate (SPIRIVA RESPIMAT) 2.5 MCG/ACT AERS Inhale 2 puffs into the lungs daily. 4 g 0   valsartan (DIOVAN) 160 MG tablet Take 80 mg by mouth daily.     No current facility-administered medications for this visit.    ALLERGIES:  Allergies  Allergen Reactions   Iodinated Diagnostic Agents Dermatitis and Itching   Prednisone Hives and Swelling    PHYSICAL EXAM:  Performance status (ECOG): 1 - Symptomatic but completely ambulatory  There were no vitals filed for this visit. Wt Readings from Last 3 Encounters:  11/29/20 169 lb 12.1 oz (77 kg)  11/22/20 171 lb 3.2 oz (77.7 kg)  10/28/20 166 lb (75.3 kg)   Physical Exam Vitals reviewed.  Constitutional:      Appearance: Normal appearance.  Cardiovascular:     Rate and Rhythm: Normal rate and regular rhythm.     Pulses: Normal pulses.     Heart sounds: Normal heart sounds.  Pulmonary:      Effort: Pulmonary effort is normal.     Breath sounds: Normal breath sounds.  Neurological:     General: No focal deficit present.     Mental Status: He is alert and oriented to person, place, and time.  Psychiatric:        Mood and Affect: Mood normal.        Behavior: Behavior normal.     LABORATORY DATA:  I have reviewed the labs as listed.  CBC Latest Ref Rng & Units 10/28/2020  WBC 4.0 - 10.5 K/uL 7.8  Hemoglobin 13.0 - 17.0 g/dL 14.0  Hematocrit 39.0 - 52.0 % 43.2  Platelets 150 - 400 K/uL 201   CMP Latest Ref  Rng & Units 10/28/2020 04/05/2018  Glucose 70 - 99 mg/dL 99 -  BUN 8 - 23 mg/dL 14 -  Creatinine 0.61 - 1.24 mg/dL 0.84 -  Sodium 135 - 145 mmol/L 135 -  Potassium 3.5 - 5.1 mmol/L 3.9 -  Chloride 98 - 111 mmol/L 102 -  CO2 22 - 32 mmol/L 26 -  Calcium 8.9 - 10.3 mg/dL 8.7(L) -  Total Protein 6.0 - 8.5 g/dL - 7.4  Total Bilirubin 0.0 - 1.2 mg/dL - 0.5  Alkaline Phos 39 - 117 IU/L - 87  AST 0 - 40 IU/L - 15  ALT 0 - 44 IU/L - 14    DIAGNOSTIC IMAGING:  I have independently reviewed the scans and discussed with the patient. NM PET Image Initial (PI) Skull Base To Thigh  Result Date: 11/26/2020 CLINICAL DATA:  Initial treatment strategy for pulmonary mass. EXAM: NUCLEAR MEDICINE PET SKULL BASE TO THIGH TECHNIQUE: 9.6 mCi F-18 FDG was injected intravenously. Full-ring PET imaging was performed from the skull base to thigh after the radiotracer. CT data was obtained and used for attenuation correction and anatomic localization. Fasting blood glucose: 88 mg/dl COMPARISON:  None. FINDINGS: Mediastinal blood pool activity: SUV max 2.1 Liver activity: SUV max NA NECK: No hypermetabolic lymph nodes in the neck. Incidental CT findings: none CHEST: Within the RIGHT lower lobe thick-walled cavitary mass measures 5.4 x 4.2 cm with SUV max equal 21.8. Hypermetabolic RIGHT hilar node measures approximate 1.9 cm on image 104/series 3. With SUV max equal 10.2. Hypermetabolic subcarinal node  SUV max equal 6.2. No hypermetabolic supraclavicular nodes. There is a hypermetabolic RIGHT paratracheal node with SUV max equal 3.7. Within the periphery of the anterior RIGHT upper lobe there is a semi-solid nodule measuring 1.6 cm with SUV max equal 9.7 (image 79/3 Incidental CT findings: Diffuse subpleural reticulation within the lungs. ABDOMEN/PELVIS: No abnormal hypermetabolic activity within the liver, pancreas, adrenal glands, or spleen. Focal metabolic activity associated with the RIGHT adrenal gland with SUV max equal 10.3. Minimal nodular thickening on the CT portion the exam. No abnormal activity in the liver. No hypermetabolic abdominopelvic lymph nodes. Incidental CT findings: LEFT colon diverticulosis. Moderate size LEFT inguinal hernia. SKELETON: No focal hypermetabolic activity to suggest skeletal metastasis. Incidental CT findings: none IMPRESSION: 1. Thick-walled hypermetabolic cavitary mass in the RIGHT lower lobe is most consistent with bronchogenic carcinoma (consider squamous cell carcinoma). 2. Metastatic adenopathy to subcarinal and RIGHT paratracheal nodal stations. Hypermetabolic adenopathy in the RIGHT hilum. No supraclavicular adenopathy. 3. High concern for metastatic disease to the RIGHT adrenal gland although minimal change on the CT portion of exam. 4. Hypermetabolic nodule in the RIGHT upper lobe is indeterminate with differential including pulmonary infection versus neoplasm. Electronically Signed   By: Suzy Bouchard M.D.   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  CT chest with contrast on 10/19/2020 with 6.9 x 4.0 x 6.1 cm mass lesion, thick-walled cavitary in the right lower lobe superior segment abutting the pleura.  Mild peribronchovascular nodularity tracking centrally towards the hilum.  Right upper paratracheal lymph node  measures 1.0.  Precarinal lymph node measures 1.4 cm.  Subcarinal lymph node measures 1.9 cm.  No bone lesions. - Seen by Dr. Dewald-bronchoscopy and EBUS on 10/28/2020 with no endobronchial lesions. - Pathology station 7 lymph node and 11R node FNA negative for malignant cells.  Right lower lobe brushing positive for malignant cells consistent with non-small cell lung cancer. - He reported 16 pound weight loss since April, gained back about 7 pounds. - He does have occasional hemoptysis, more pronounced since bronchoscopy. - PET scan on 11/25/2020 showed thick-walled hypermetabolic cavitary mass in the right lower lobe consistent with bronchogenic carcinoma.  Metastatic adenopathy to subcarinal, right paratracheal and right hilum.  Hypermetabolic nodule in the right upper lobe indeterminate measuring 1.6 cm.  High concern for metastatic disease to the right adrenal gland with SUV 10.3 with with minimal nodular thickening on the CT portion.  2.  Social/family history: - He lives at home with his wife.  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  Patient and wife think there could be a possible exposure to asbestos.  He is smoking few cigarettes daily up to 10.  He previously smoked 2 packs/day for more than 40 years. - No family history of malignancy.   PLAN:  Clinical stage IIIb (T3 N2 M0) non-small cell lung cancer of the right lower lobe: - He has put MRI and right adrenal biopsy on hold. - I have again discussed the results of the PET scan from 11/25/2020 and the importance of having biopsy of the right adrenal lesion for accurate staging and further treatment plan. 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  No approved medication for this mutation at this time. - I have also recommended sending NGS testing on the right  adrenal biopsy if it is metastatic disease.  We also discussed best supportive care in the form of palliative care/hospice if he chose not to do any aggressive measures. - He would like to think about it and [place]  2.  Right chest wall pain: - He is taking hydrocodone 5/325 once daily.  He still has some pain on the right chest wall from repeated coughing. - Have told him to increase hydrocodone to twice daily.   Orders placed this encounter:  No orders of the defined types were placed in this encounter.  Total time spent is 30 minutes with more than 50% of the time spent face-to-face discussing scan results, guardant 360 results, further work-up, counseling and coordination of care.  SDerek Jack MD [phone]  I, KThana Ates am acting as a scribe for Dr. SDerek Jack   I, SDerek JackMD, have reviewed the above documentation for accuracy and completeness, and I agree with the above.

## 2020-12-09 ENCOUNTER — Inpatient Hospital Stay (HOSPITAL_BASED_OUTPATIENT_CLINIC_OR_DEPARTMENT_OTHER): Payer: No Typology Code available for payment source | Admitting: Hematology

## 2020-12-09 ENCOUNTER — Encounter (HOSPITAL_COMMUNITY): Admission: RE | Payer: Self-pay | Source: Home / Self Care

## 2020-12-09 ENCOUNTER — Encounter (HOSPITAL_COMMUNITY): Payer: Self-pay

## 2020-12-09 ENCOUNTER — Ambulatory Visit (HOSPITAL_COMMUNITY): Payer: No Typology Code available for payment source

## 2020-12-09 ENCOUNTER — Other Ambulatory Visit: Payer: Self-pay

## 2020-12-09 ENCOUNTER — Ambulatory Visit (HOSPITAL_COMMUNITY): Admission: RE | Admit: 2020-12-09 | Payer: No Typology Code available for payment source | Source: Home / Self Care

## 2020-12-09 VITALS — BP 160/79 | HR 68 | Temp 97.0°F | Resp 18 | Wt 175.0 lb

## 2020-12-09 DIAGNOSIS — C3490 Malignant neoplasm of unspecified part of unspecified bronchus or lung: Secondary | ICD-10-CM

## 2020-12-09 SURGERY — MRI WITH ANESTHESIA
Anesthesia: General

## 2020-12-09 NOTE — Patient Instructions (Addendum)
[place] at [facility] Discharge Instructions   You were seen and examined by Dr. Delton Coombes. The Guardant 360 test did not show any options to treat this cancer at this time. Call the clinic and let us know if you decide to proceed with the biopsy and we will send for further testing to determine treatment options.   Thank you for choosing [place] at [facility] to provide your oncology and hematology care.  To afford each patient quality time with our provider, please arrive at least 15 minutes before your scheduled appointment time.   If you have a lab appointment with the [facility] please come in thru the Main Entrance and check in at the main information desk.  You need to re-schedule your appointment should you arrive 10 or more minutes late.  We strive to give you quality time with our providers, and arriving late affects you and other patients whose appointments are after yours.  Also, if you no show three or more times for appointments you may be dismissed from the clinic at the providers discretion.     Again, thank you for choosing [facility].  Our hope is that these requests will decrease the amount of time that you wait before being seen by our physicians.       _____________________________________________________________  Should you have questions after your visit to [facility], please contact our office at [phone] and follow the prompts.  Our office hours are 8:00 a.m. and 4:30 p.m. Monday - Friday.  Please note that voicemails left after 4:00 p.m. may not be returned until the following business day.  We are closed weekends and major holidays.  You do have access to a nurse 24-7, just call the main number to the clinic [phone] and do not press any options, hold on the line and a nurse will answer the phone.    For prescription refill requests, have your pharmacy contact our office and  allow 72 hours.    Due to Covid, you will need to wear a mask upon entering the hospital. If you do not have a mask, a mask will be given to you at the Main Entrance upon arrival. For doctor visits, patients may have 1 support person age 42 or older with them. For treatment visits, patients can not have anyone with them due to social distancing guidelines and our immunocompromised population.

## 2020-12-10 ENCOUNTER — Other Ambulatory Visit (HOSPITAL_COMMUNITY): Payer: Self-pay | Admitting: *Deleted

## 2020-12-10 MED ORDER — HYDROCODONE-ACETAMINOPHEN 5-325 MG PO TABS: 1.0000 | ORAL_TABLET | Freq: Two times a day (BID) | ORAL | 0 refills | Status: AC | PRN

## 2020-12-14 ENCOUNTER — Ambulatory Visit (HOSPITAL_COMMUNITY): Payer: No Typology Code available for payment source | Admitting: Hematology

## 2020-12-16 ENCOUNTER — Ambulatory Visit (HOSPITAL_COMMUNITY): Payer: No Typology Code available for payment source | Admitting: Hematology

## 2020-12-20 ENCOUNTER — Ambulatory Visit (HOSPITAL_COMMUNITY): Payer: No Typology Code available for payment source | Admitting: Hematology

## 2021-01-14 ENCOUNTER — Ambulatory Visit: Payer: Non-veteran care | Admitting: Pulmonary Disease

## 2021-01-25 ENCOUNTER — Telehealth (HOSPITAL_COMMUNITY): Payer: Self-pay

## 2021-01-25 NOTE — Telephone Encounter (Signed)
Called and spoke with Aldona Bar patient's wife pertaining to her question about palliative care and pain management .  Per Dr. Tomie China message," I think they need to enroll in palliative care for better pain control as they are not following up with [place]. Or they can reach out to PMD ".  Understanding verbalized by wife. All questions answered by wife. Aldona Bar states , " I will talk to him on the way home and we still may make an appointment with Dr. Delton Coombes to see other options".

## 2021-02-27 DEATH — deceased

## 2022-12-21 IMAGING — PT NM PET TUM IMG INITIAL (PI) SKULL BASE T - THIGH
1 series · 8 of 8 positions shown · non-contrast
Comparison: None.

CLINICAL DATA: Initial treatment strategy for pulmonary mass.

EXAM:
NUCLEAR MEDICINE PET SKULL BASE TO THIGH
TECHNIQUE: 9.6 mCi F-18 FDG was injected intravenously. Full-ring PET imaging
was performed from the skull base to thigh after the radiotracer. CT
data was obtained and used for attenuation correction and anatomic
localization.
Fasting blood glucose: 88 mg/dl

[Series 1036: results mm oncology reading · 1.0mm · 1.01mm/px · 8 of 8 slices shown]
[im 1/8]
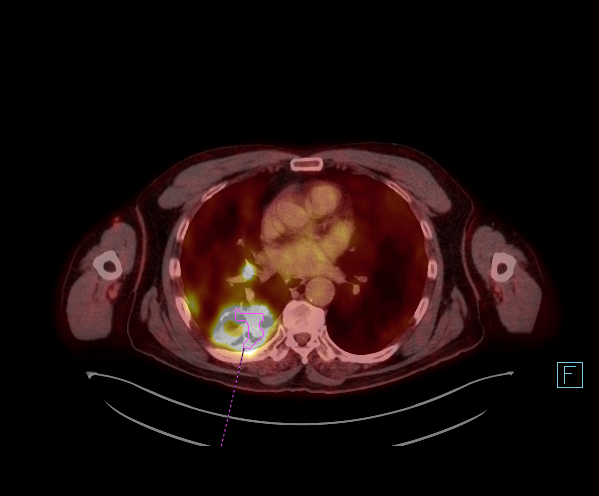
[im 2/8]
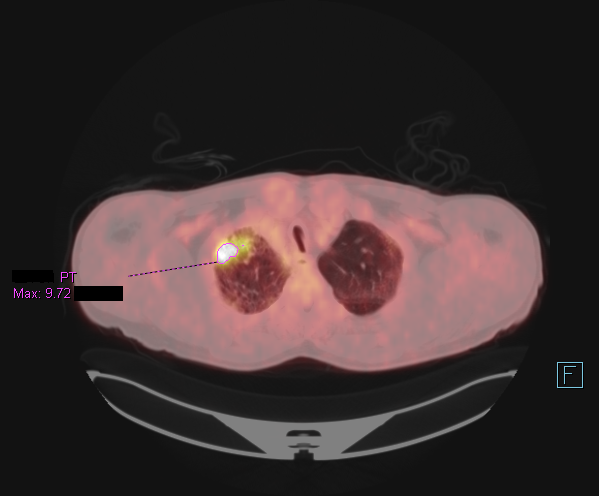
[im 3/8]
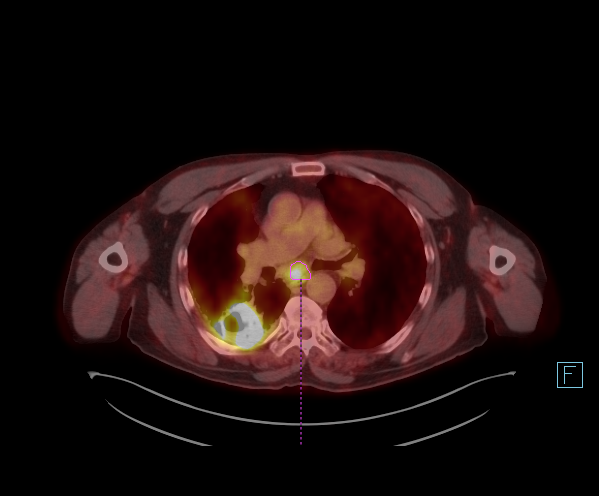
[im 4/8]
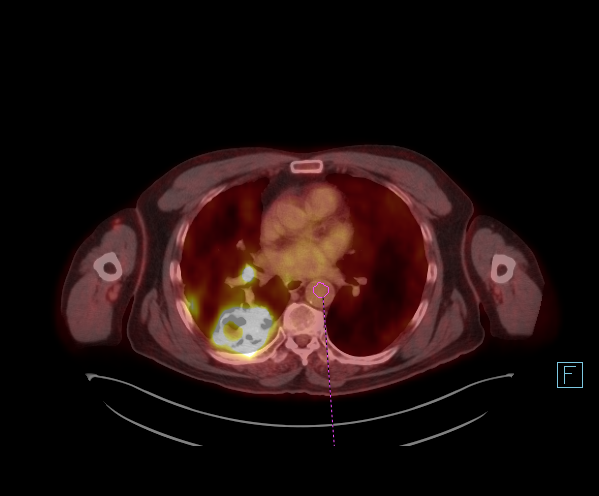
[im 5/8]
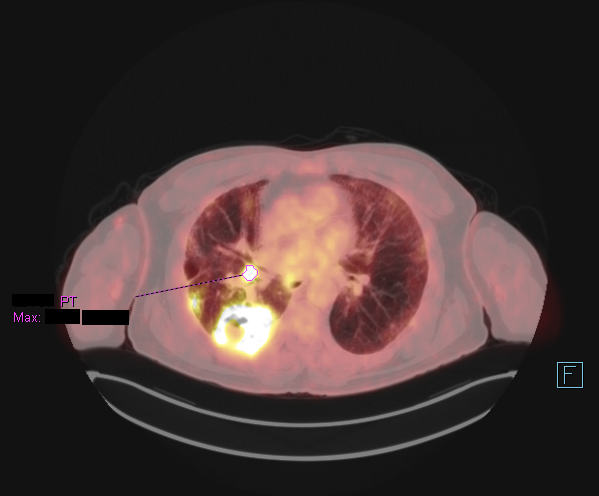
[im 6/8]
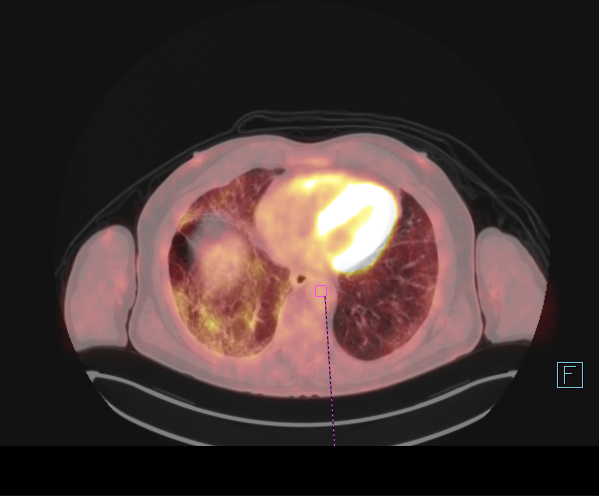
[im 7/8]
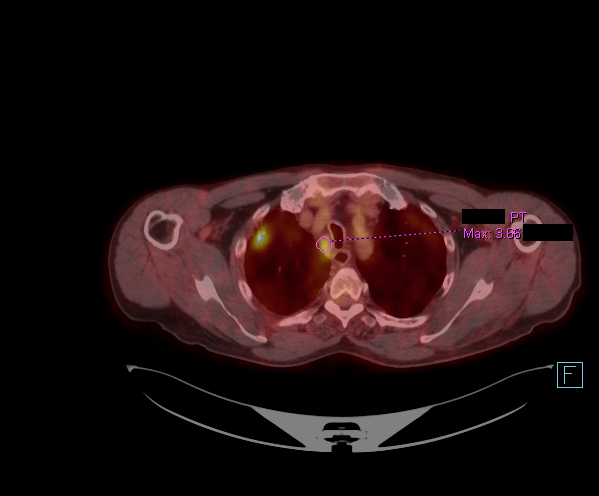
[im 8/8]
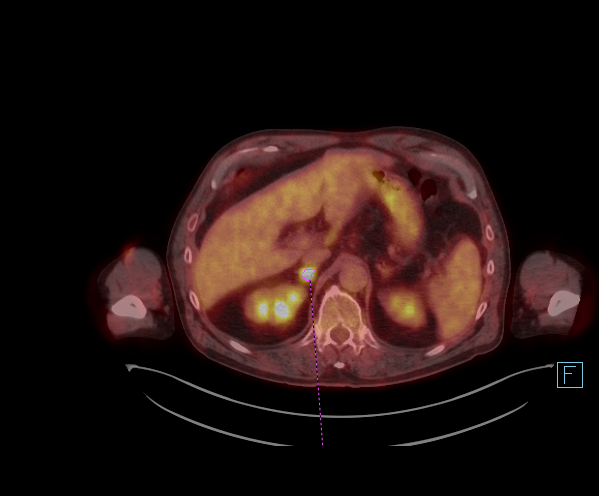

[8 of 8 positions shown; findings below may reference images not displayed]

FINDINGS: Mediastinal blood pool activity: SUV max

Liver activity: SUV max NA

NECK: No hypermetabolic lymph nodes in the neck.

Incidental CT findings: none

CHEST: Within the RIGHT lower lobe thick-walled cavitary mass
measures 5.4 x 4.2 cm with SUV max equal 21.8.

Hypermetabolic RIGHT hilar node measures approximate 1.9 cm on image
104/series 3. With SUV max equal 10.2.

Hypermetabolic subcarinal node SUV max equal 6.2.

No hypermetabolic supraclavicular nodes. There is a hypermetabolic
RIGHT paratracheal node with SUV max equal 3.7.

Within the periphery of the anterior RIGHT upper lobe there is a
semi-solid nodule measuring 1.6 cm with SUV max equal 9.7 (image
79/3

Incidental CT findings: Diffuse subpleural reticulation within the
lungs.

ABDOMEN/PELVIS: No abnormal hypermetabolic activity within the
liver, pancreas, adrenal glands, or spleen. Focal metabolic activity
associated with the RIGHT adrenal gland with SUV max equal 10.3.
Minimal nodular thickening on the CT portion the exam.

No abnormal activity in the liver.

No hypermetabolic abdominopelvic lymph nodes.

Incidental CT findings: LEFT colon diverticulosis. Moderate size
LEFT inguinal hernia.

SKELETON: No focal hypermetabolic activity to suggest skeletal
metastasis.

Incidental CT findings: none
IMPRESSION: 1. Thick-walled hypermetabolic cavitary mass in the RIGHT lower lobe
is most consistent with bronchogenic carcinoma (consider squamous
cell carcinoma).
2. Metastatic adenopathy to subcarinal and RIGHT paratracheal nodal
stations. Hypermetabolic adenopathy in the RIGHT hilum. No
supraclavicular adenopathy.
3. High concern for metastatic disease to the RIGHT adrenal gland
although minimal change on the CT portion of exam.
4. Hypermetabolic nodule in the RIGHT upper lobe is indeterminate
with differential including pulmonary infection versus neoplasm.
